# Patient Record
Sex: Female | Born: 1945 | Race: White | Hispanic: No | Marital: Single | Smoking: Current every day smoker
Health system: Southern US, Community
[De-identification: ages and names within clinical notes are randomized; demographics above are authoritative.]

## PROBLEM LIST (undated history)

## (undated) DIAGNOSIS — J441 Chronic obstructive pulmonary disease with (acute) exacerbation: Secondary | ICD-10-CM

## (undated) DIAGNOSIS — F17200 Nicotine dependence, unspecified, uncomplicated: Secondary | ICD-10-CM

## (undated) DIAGNOSIS — E785 Hyperlipidemia, unspecified: Secondary | ICD-10-CM

## (undated) DIAGNOSIS — F4310 Post-traumatic stress disorder, unspecified: Secondary | ICD-10-CM

## (undated) DIAGNOSIS — N6019 Diffuse cystic mastopathy of unspecified breast: Secondary | ICD-10-CM

## (undated) DIAGNOSIS — K219 Gastro-esophageal reflux disease without esophagitis: Secondary | ICD-10-CM

## (undated) DIAGNOSIS — I10 Essential (primary) hypertension: Secondary | ICD-10-CM

## (undated) DIAGNOSIS — Z0000 Encounter for general adult medical examination without abnormal findings: Secondary | ICD-10-CM

## (undated) DIAGNOSIS — L309 Dermatitis, unspecified: Secondary | ICD-10-CM

## (undated) DIAGNOSIS — C73 Malignant neoplasm of thyroid gland: Secondary | ICD-10-CM

## (undated) DIAGNOSIS — J449 Chronic obstructive pulmonary disease, unspecified: Secondary | ICD-10-CM

## (undated) DIAGNOSIS — G4700 Insomnia, unspecified: Secondary | ICD-10-CM

## (undated) DIAGNOSIS — M1990 Unspecified osteoarthritis, unspecified site: Secondary | ICD-10-CM

## (undated) DIAGNOSIS — C3490 Malignant neoplasm of unspecified part of unspecified bronchus or lung: Secondary | ICD-10-CM

## (undated) DIAGNOSIS — E039 Hypothyroidism, unspecified: Secondary | ICD-10-CM

## (undated) DIAGNOSIS — B029 Zoster without complications: Secondary | ICD-10-CM

## (undated) DIAGNOSIS — R1319 Other dysphagia: Secondary | ICD-10-CM

## (undated) HISTORY — DX: Dermatitis, unspecified: L30.9

## (undated) HISTORY — DX: Unspecified osteoarthritis, unspecified site: M19.90

## (undated) HISTORY — PX: OOPHORECTOMY: SHX86

## (undated) HISTORY — DX: Diffuse cystic mastopathy of unspecified breast: N60.19

## (undated) HISTORY — PX: THYROID SURGERY: SHX805

## (undated) HISTORY — DX: Chronic obstructive pulmonary disease with (acute) exacerbation: J44.1

## (undated) HISTORY — DX: Hypothyroidism, unspecified: E03.9

## (undated) HISTORY — DX: Zoster without complications: B02.9

## (undated) HISTORY — DX: Other dysphagia: R13.19

## (undated) HISTORY — DX: Nicotine dependence, unspecified, uncomplicated: F17.200

## (undated) HISTORY — DX: Gastro-esophageal reflux disease without esophagitis: K21.9

## (undated) HISTORY — DX: Chronic obstructive pulmonary disease, unspecified: J44.9

## (undated) HISTORY — DX: Hyperlipidemia, unspecified: E78.5

## (undated) HISTORY — DX: Malignant neoplasm of thyroid gland: C73

## (undated) HISTORY — PX: WISDOM TOOTH EXTRACTION: SHX21

## (undated) HISTORY — DX: Essential (primary) hypertension: I10

## (undated) HISTORY — DX: Insomnia, unspecified: G47.00

## (undated) HISTORY — PX: UTERINE FIBROID EMBOLIZATION: SHX825

## (undated) HISTORY — DX: Encounter for general adult medical examination without abnormal findings: Z00.00

## (undated) HISTORY — DX: Malignant neoplasm of unspecified part of unspecified bronchus or lung: C34.90

## (undated) HISTORY — PX: BREAST CYST EXCISION: SHX579

## (undated) HISTORY — DX: Post-traumatic stress disorder, unspecified: F43.10

---

## 2008-10-04 LAB — HM PAP SMEAR

## 2012-10-04 DIAGNOSIS — B029 Zoster without complications: Secondary | ICD-10-CM

## 2012-10-04 HISTORY — DX: Zoster without complications: B02.9

## 2012-11-29 DIAGNOSIS — K5900 Constipation, unspecified: Secondary | ICD-10-CM | POA: Diagnosis not present

## 2012-11-29 DIAGNOSIS — I10 Essential (primary) hypertension: Secondary | ICD-10-CM | POA: Diagnosis not present

## 2012-11-29 DIAGNOSIS — R1032 Left lower quadrant pain: Secondary | ICD-10-CM | POA: Diagnosis not present

## 2012-11-29 DIAGNOSIS — B029 Zoster without complications: Secondary | ICD-10-CM | POA: Diagnosis not present

## 2012-11-29 DIAGNOSIS — Z0000 Encounter for general adult medical examination without abnormal findings: Secondary | ICD-10-CM | POA: Diagnosis not present

## 2012-12-06 DIAGNOSIS — R1032 Left lower quadrant pain: Secondary | ICD-10-CM | POA: Diagnosis not present

## 2013-01-10 ENCOUNTER — Other Ambulatory Visit (HOSPITAL_BASED_OUTPATIENT_CLINIC_OR_DEPARTMENT_OTHER): Payer: Self-pay | Admitting: Family Medicine

## 2013-01-10 ENCOUNTER — Other Ambulatory Visit (HOSPITAL_BASED_OUTPATIENT_CLINIC_OR_DEPARTMENT_OTHER): Payer: Self-pay | Admitting: *Deleted

## 2013-01-10 DIAGNOSIS — Z1231 Encounter for screening mammogram for malignant neoplasm of breast: Secondary | ICD-10-CM

## 2013-01-18 ENCOUNTER — Ambulatory Visit (HOSPITAL_BASED_OUTPATIENT_CLINIC_OR_DEPARTMENT_OTHER)
Admission: RE | Admit: 2013-01-18 | Discharge: 2013-01-18 | Disposition: A | Discharge status: Home / Self Care | Payer: Medicare Other | Source: Ambulatory Visit | Attending: Family Medicine | Admitting: Family Medicine

## 2013-01-18 DIAGNOSIS — Z1231 Encounter for screening mammogram for malignant neoplasm of breast: Secondary | ICD-10-CM

## 2013-02-10 DIAGNOSIS — K219 Gastro-esophageal reflux disease without esophagitis: Secondary | ICD-10-CM | POA: Diagnosis not present

## 2013-02-10 DIAGNOSIS — J441 Chronic obstructive pulmonary disease with (acute) exacerbation: Secondary | ICD-10-CM | POA: Diagnosis not present

## 2013-02-10 DIAGNOSIS — J329 Chronic sinusitis, unspecified: Secondary | ICD-10-CM | POA: Diagnosis not present

## 2013-02-10 DIAGNOSIS — J45909 Unspecified asthma, uncomplicated: Secondary | ICD-10-CM | POA: Diagnosis not present

## 2013-03-09 DIAGNOSIS — H2510 Age-related nuclear cataract, unspecified eye: Secondary | ICD-10-CM | POA: Diagnosis not present

## 2013-04-10 ENCOUNTER — Ambulatory Visit (INDEPENDENT_AMBULATORY_CARE_PROVIDER_SITE_OTHER): Payer: Medicare Other | Admitting: Family Medicine

## 2013-04-10 ENCOUNTER — Telehealth: Payer: Self-pay | Admitting: Family Medicine

## 2013-04-10 ENCOUNTER — Encounter: Payer: Self-pay | Admitting: Family Medicine

## 2013-04-10 VITALS — BP 138/78 | HR 88 | Temp 98.0°F | Ht 64.0 in | Wt 137.1 lb

## 2013-04-10 DIAGNOSIS — I10 Essential (primary) hypertension: Secondary | ICD-10-CM | POA: Insufficient documentation

## 2013-04-10 DIAGNOSIS — N6019 Diffuse cystic mastopathy of unspecified breast: Secondary | ICD-10-CM

## 2013-04-10 DIAGNOSIS — E782 Mixed hyperlipidemia: Secondary | ICD-10-CM | POA: Insufficient documentation

## 2013-04-10 DIAGNOSIS — F4310 Post-traumatic stress disorder, unspecified: Secondary | ICD-10-CM

## 2013-04-10 DIAGNOSIS — L259 Unspecified contact dermatitis, unspecified cause: Secondary | ICD-10-CM | POA: Diagnosis not present

## 2013-04-10 DIAGNOSIS — E039 Hypothyroidism, unspecified: Secondary | ICD-10-CM

## 2013-04-10 DIAGNOSIS — E785 Hyperlipidemia, unspecified: Secondary | ICD-10-CM

## 2013-04-10 DIAGNOSIS — J441 Chronic obstructive pulmonary disease with (acute) exacerbation: Secondary | ICD-10-CM | POA: Diagnosis not present

## 2013-04-10 DIAGNOSIS — Z0000 Encounter for general adult medical examination without abnormal findings: Secondary | ICD-10-CM

## 2013-04-10 DIAGNOSIS — J449 Chronic obstructive pulmonary disease, unspecified: Secondary | ICD-10-CM | POA: Insufficient documentation

## 2013-04-10 DIAGNOSIS — B029 Zoster without complications: Secondary | ICD-10-CM | POA: Insufficient documentation

## 2013-04-10 DIAGNOSIS — L309 Dermatitis, unspecified: Secondary | ICD-10-CM

## 2013-04-10 HISTORY — DX: Encounter for general adult medical examination without abnormal findings: Z00.00

## 2013-04-10 HISTORY — DX: Post-traumatic stress disorder, unspecified: F43.10

## 2013-04-10 HISTORY — DX: Diffuse cystic mastopathy of unspecified breast: N60.19

## 2013-04-10 HISTORY — DX: Hyperlipidemia, unspecified: E78.5

## 2013-04-10 HISTORY — DX: Dermatitis, unspecified: L30.9

## 2013-04-10 MED ORDER — HYDROCORTISONE-ACETIC ACID 1-2 % OT SOLN: 4.0000 [drp] | Freq: Two times a day (BID) | OTIC | Status: DC

## 2013-04-10 MED ORDER — CLOBETASOL PROPIONATE 0.05 % EX OINT: TOPICAL_OINTMENT | Freq: Two times a day (BID) | CUTANEOUS | Status: DC

## 2013-04-10 NOTE — Assessment & Plan Note (Signed)
Recent episode over left flank, declines shingles shots

## 2013-04-10 NOTE — Assessment & Plan Note (Signed)
Well controlled, no changes 

## 2013-04-10 NOTE — Assessment & Plan Note (Addendum)
Clobetasol cream given to use over area.

## 2013-04-10 NOTE — Assessment & Plan Note (Signed)
Tearful initially in visit but believes over all she is doing better as her divorce has finalized. She has done a great deal of counseling but she will return if she needs to
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
## 2013-04-10 NOTE — Assessment & Plan Note (Signed)
Declines Zostavax and colonoscopy. Request old records

## 2013-04-10 NOTE — Telephone Encounter (Signed)
Labs prior to visit, lipid, renal, hepatic, tsh, cbc   Patient has appointment on 06/01/13, she will be going to [facility] lab

## 2013-04-10 NOTE — Patient Instructions (Addendum)
Labs prior to visit, lipid, renal, hepatic, tsh, cbc  Probiotics daily such as Digestive Advantage   Moist heat to neck and Salon Pas cream  Preventive Care for Adults, Female A healthy lifestyle and preventive care can promote health and wellness. Preventive health guidelines for women include the following key practices.  A routine yearly physical is a good way to check with your caregiver about your health and preventive screening. It is a chance to share any concerns and updates on your health, and to receive a thorough exam.  Visit your dentist for a routine exam and preventive care every 6 months. Brush your teeth twice a day and floss once a day. Good oral hygiene prevents tooth decay and gum disease.  The frequency of eye exams is based on your age, health, family medical history, use of contact lenses, and other factors. Follow your caregiver's recommendations for frequency of eye exams.  Eat a healthy diet. Foods like vegetables, fruits, whole grains, low-fat dairy products, and lean protein foods contain the nutrients you need without too many calories. Decrease your intake of foods high in solid fats, added sugars, and salt. Eat the right amount of calories for you.Get information about a proper diet from your caregiver, if necessary.  Regular physical exercise is one of the most important things you can do for your health. Most adults should get at least 150 minutes of moderate-intensity exercise (any activity that increases your heart rate and causes you to sweat) each week. In addition, most adults need muscle-strengthening exercises on 2 or more days a week.  Maintain a healthy weight. The body mass index (BMI) is a screening tool to identify possible weight problems. It provides an estimate of body fat based on height and weight. Your caregiver can help determine your BMI, and can help you achieve or maintain a healthy weight.For adults 20 years and older:  A BMI below 18.5 is  considered underweight.  A BMI of 18.5 to 24.9 is normal.  A BMI of 25 to 29.9 is considered overweight.  A BMI of 30 and above is considered obese.  Maintain normal blood lipids and cholesterol levels by exercising and minimizing your intake of saturated fat. Eat a balanced diet with plenty of fruit and vegetables. Blood tests for lipids and cholesterol should begin at age 67 and be repeated every 5 years. If your lipid or cholesterol levels are high, you are over 50, or you are at high risk for heart disease, you may need your cholesterol levels checked more frequently.Ongoing high lipid and cholesterol levels should be treated with medicines if diet and exercise are not effective.  If you smoke, find out from your caregiver how to quit. If you do not use tobacco, do not start.  If you are pregnant, do not drink alcohol. If you are breastfeeding, be very cautious about drinking alcohol. If you are not pregnant and choose to drink alcohol, do not exceed 1 drink per day. One drink is considered to be 12 ounces (355 mL) of beer, 5 ounces (148 mL) of wine, or 1.5 ounces (44 mL) of liquor.  Avoid use of street drugs. Do not share needles with anyone. Ask for help if you need support or instructions about stopping the use of drugs.  High blood pressure causes heart disease and increases the risk of stroke. Your blood pressure should be checked at least every 1 to 2 years. Ongoing high blood pressure should be treated with medicines if weight loss  and exercise are not effective.  If you are 50 to 65 years old, ask your caregiver if you should take aspirin to prevent strokes.  Diabetes screening involves taking a blood sample to check your fasting blood sugar level. This should be done once every 3 years, after age 63, if you are within normal weight and without risk factors for diabetes. Testing should be considered at a younger age or be carried out more frequently if you are overweight and have at  least 1 risk factor for diabetes.  Breast cancer screening is essential preventive care for women. You should practice "breast self-awareness." This means understanding the normal appearance and feel of your breasts and may include breast self-examination. Any changes detected, no matter how small, should be reported to a caregiver. Women in their 11s and 30s should have a clinical breast exam (CBE) by a caregiver as part of a regular health exam every 1 to 3 years. After age 30, women should have a CBE every year. Starting at age 66, women should consider having a mammography (breast X-ray test) every year. Women who have a family history of breast cancer should talk to their caregiver about genetic screening. Women at a high risk of breast cancer should talk to their caregivers about having magnetic resonance imaging (MRI) and a mammography every year.  The Pap test is a screening test for cervical cancer. A Pap test can show cell changes on the cervix that might become cervical cancer if left untreated. A Pap test is a procedure in which cells are obtained and examined from the lower end of the uterus (cervix).  Women should have a Pap test starting at age 77.  Between ages 19 and 70, Pap tests should be repeated every 2 years.  Beginning at age 71, you should have a Pap test every 3 years as long as the past 3 Pap tests have been normal.  Some women have medical problems that increase the chance of getting cervical cancer. Talk to your caregiver about these problems. It is especially important to talk to your caregiver if a new problem develops soon after your last Pap test. In these cases, your caregiver may recommend more frequent screening and Pap tests.  The above recommendations are the same for women who have or have not gotten the vaccine for human papillomavirus (HPV).  If you had a hysterectomy for a problem that was not cancer or a condition that could lead to cancer, then you no longer  need Pap tests. Even if you no longer need a Pap test, a regular exam is a good idea to make sure no other problems are starting.  If you are between ages 87 and 4, and you have had normal Pap tests going back 10 years, you no longer need Pap tests. Even if you no longer need a Pap test, a regular exam is a good idea to make sure no other problems are starting.  If you have had past treatment for cervical cancer or a condition that could lead to cancer, you need Pap tests and screening for cancer for at least 20 years after your treatment.  If Pap tests have been discontinued, risk factors (such as a new sexual partner) need to be reassessed to determine if screening should be resumed.  The HPV test is an additional test that may be used for cervical cancer screening. The HPV test looks for the virus that can cause the cell changes on the cervix. The  cells collected during the Pap test can be tested for HPV. The HPV test could be used to screen women aged 70 years and older, and should be used in women of any age who have unclear Pap test results. After the age of 27, women should have HPV testing at the same frequency as a Pap test.  Colorectal cancer can be detected and often prevented. Most routine colorectal cancer screening begins at the age of 73 and continues through age 35. However, your caregiver may recommend screening at an earlier age if you have risk factors for colon cancer. On a yearly basis, your caregiver may provide home test kits to check for hidden blood in the stool. Use of a small camera at the end of a tube, to directly examine the colon (sigmoidoscopy or colonoscopy), can detect the earliest forms of colorectal cancer. Talk to your caregiver about this at age 4, when routine screening begins. Direct examination of the colon should be repeated every 5 to 10 years through age 62, unless early forms of pre-cancerous polyps or small growths are found.  Hepatitis C blood testing is  recommended for all people born from [birth year] through 1965 and any individual with known risks for hepatitis C.  Practice safe sex. Use condoms and avoid high-risk sexual practices to reduce the spread of sexually transmitted infections (STIs). STIs include gonorrhea, chlamydia, syphilis, trichomonas, herpes, HPV, and human immunodeficiency virus (HIV). Herpes, HIV, and HPV are viral illnesses that have no cure. They can result in disability, cancer, and death. Sexually active women aged 57 and younger should be checked for chlamydia. Older women with new or multiple partners should also be tested for chlamydia. Testing for other STIs is recommended if you are sexually active and at increased risk.  Osteoporosis is a disease in which the bones lose minerals and strength with aging. This can result in serious bone fractures. The risk of osteoporosis can be identified using a bone density scan. Women ages 38 and over and women at risk for fractures or osteoporosis should discuss screening with their caregivers. Ask your caregiver whether you should take a calcium supplement or vitamin D to reduce the rate of osteoporosis.  Menopause can be associated with physical symptoms and risks. Hormone replacement therapy is available to decrease symptoms and risks. You should talk to your caregiver about whether hormone replacement therapy is right for you.  Use sunscreen with sun protection factor (SPF) of 30 or more. Apply sunscreen liberally and repeatedly throughout the day. You should seek shade when your shadow is shorter than you. Protect yourself by wearing long sleeves, pants, a wide-brimmed hat, and sunglasses year round, whenever you are outdoors.  Once a month, do a whole body skin exam, using a mirror to look at the skin on your back. Notify your caregiver of new moles, moles that have irregular borders, moles that are larger than a pencil eraser, or moles that have changed in shape or color.  Stay current  with required immunizations.  Influenza. You need a dose every fall (or winter). The composition of the flu vaccine changes each year, so being vaccinated once is not enough.  Pneumococcal polysaccharide. You need 1 to 2 doses if you smoke cigarettes or if you have certain chronic medical conditions. You need 1 dose at age 19 (or older) if you have never been vaccinated.  Tetanus, diphtheria, pertussis (Tdap, Td). Get 1 dose of Tdap vaccine if you are younger than age 41, are over 15  and have contact with an infant, are a Research scientist (physical sciences), are pregnant, or simply want to be protected from whooping cough. After that, you need a Td booster dose every 10 years. Consult your caregiver if you have not had at least 3 tetanus and diphtheria-containing shots sometime in your life or have a deep or dirty wound.  HPV. You need this vaccine if you are a woman age 75 or younger. The vaccine is given in 3 doses over 6 months.  Measles, mumps, rubella (MMR). You need at least 1 dose of MMR if you were born in 1957 or later. You may also need a second dose.  Meningococcal. If you are age 55 to 72 and a first-year college student living in a residence hall, or have one of several medical conditions, you need to get vaccinated against meningococcal disease. You may also need additional booster doses.  Zoster (shingles). If you are age 4 or older, you should get this vaccine.  Varicella (chickenpox). If you have never had chickenpox or you were vaccinated but received only 1 dose, talk to your caregiver to find out if you need this vaccine.  Hepatitis A. You need this vaccine if you have a specific risk factor for hepatitis A virus infection or you simply wish to be protected from this disease. The vaccine is usually given as 2 doses, 6 to 18 months apart.  Hepatitis B. You need this vaccine if you have a specific risk factor for hepatitis B virus infection or you simply wish to be protected from this disease.  The vaccine is given in 3 doses, usually over 6 months. Preventive Services / Frequency Ages 31 to 46  Blood pressure check.** / Every 1 to 2 years.  Lipid and cholesterol check.** / Every 5 years beginning at age 46.  Clinical breast exam.** / Every 3 years for women in their 25s and 30s.  Pap test.** / Every 2 years from ages 58 through 8. Every 3 years starting at age 61 through age 58 or 19 with a history of 3 consecutive normal Pap tests.  HPV screening.** / Every 3 years from ages 8 through ages 43 to 3 with a history of 3 consecutive normal Pap tests.  Hepatitis C blood test.** / For any individual with known risks for hepatitis C.  Skin self-exam. / Monthly.  Influenza immunization.** / Every year.  Pneumococcal polysaccharide immunization.** / 1 to 2 doses if you smoke cigarettes or if you have certain chronic medical conditions.  Tetanus, diphtheria, pertussis (Tdap, Td) immunization. / A one-time dose of Tdap vaccine. After that, you need a Td booster dose every 10 years.  HPV immunization. / 3 doses over 6 months, if you are 77 and younger.  Measles, mumps, rubella (MMR) immunization. / You need at least 1 dose of MMR if you were born in 1957 or later. You may also need a second dose.  Meningococcal immunization. / 1 dose if you are age 69 to 67 and a first-year college student living in a residence hall, or have one of several medical conditions, you need to get vaccinated against meningococcal disease. You may also need additional booster doses.  Varicella immunization.** / Consult your caregiver.  Hepatitis A immunization.** / Consult your caregiver. 2 doses, 6 to 18 months apart.  Hepatitis B immunization.** / Consult your caregiver. 3 doses usually over 6 months. Ages 35 to 75  Blood pressure check.** / Every 1 to 2 years.  Lipid and cholesterol check.** /  Every 5 years beginning at age 41.  Clinical breast exam.** / Every year after age  45.  Mammogram.** / Every year beginning at age 64 and continuing for as long as you are in good health. Consult with your caregiver.  Pap test.** / Every 3 years starting at age 43 through age 87 or 67 with a history of 3 consecutive normal Pap tests.  HPV screening.** / Every 3 years from ages 72 through ages 11 to 87 with a history of 3 consecutive normal Pap tests.  Fecal occult blood test (FOBT) of stool. / Every year beginning at age 54 and continuing until age 69. You may not need to do this test if you get a colonoscopy every 10 years.  Flexible sigmoidoscopy or colonoscopy.** / Every 5 years for a flexible sigmoidoscopy or every 10 years for a colonoscopy beginning at age 59 and continuing until age 40.  Hepatitis C blood test.** / For all people born from [birth year] through 1965 and any individual with known risks for hepatitis C.  Skin self-exam. / Monthly.  Influenza immunization.** / Every year.  Pneumococcal polysaccharide immunization.** / 1 to 2 doses if you smoke cigarettes or if you have certain chronic medical conditions.  Tetanus, diphtheria, pertussis (Tdap, Td) immunization.** / A one-time dose of Tdap vaccine. After that, you need a Td booster dose every 10 years.  Measles, mumps, rubella (MMR) immunization. / You need at least 1 dose of MMR if you were born in 1957 or later. You may also need a second dose.  Varicella immunization.** / Consult your caregiver.  Meningococcal immunization.** / Consult your caregiver.  Hepatitis A immunization.** / Consult your caregiver. 2 doses, 6 to 18 months apart.  Hepatitis B immunization.** / Consult your caregiver. 3 doses, usually over 6 months. Ages 26 and over  Blood pressure check.** / Every 1 to 2 years.  Lipid and cholesterol check.** / Every 5 years beginning at age 72.  Clinical breast exam.** / Every year after age 48.  Mammogram.** / Every year beginning at age 61 and continuing for as long as you are in good  health. Consult with your caregiver.  Pap test.** / Every 3 years starting at age 57 through age 5 or 38 with a 3 consecutive normal Pap tests. Testing can be stopped between 65 and 70 with 3 consecutive normal Pap tests and no abnormal Pap or HPV tests in the past 10 years.  HPV screening.** / Every 3 years from ages 25 through ages 77 or 29 with a history of 3 consecutive normal Pap tests. Testing can be stopped between 65 and 70 with 3 consecutive normal Pap tests and no abnormal Pap or HPV tests in the past 10 years.  Fecal occult blood test (FOBT) of stool. / Every year beginning at age 6 and continuing until age 68. You may not need to do this test if you get a colonoscopy every 10 years.  Flexible sigmoidoscopy or colonoscopy.** / Every 5 years for a flexible sigmoidoscopy or every 10 years for a colonoscopy beginning at age 70 and continuing until age 49.  Hepatitis C blood test.** / For all people born from [birth year] through 1965 and any individual with known risks for hepatitis C.  Osteoporosis screening.** / A one-time screening for women ages 73 and over and women at risk for fractures or osteoporosis.  Skin self-exam. / Monthly.  Influenza immunization.** / Every year.  Pneumococcal polysaccharide immunization.** / 1 dose at age 48 (  or older) if you have never been vaccinated.  Tetanus, diphtheria, pertussis (Tdap, Td) immunization. / A one-time dose of Tdap vaccine if you are over 65 and have contact with an infant, are a Research scientist (physical sciences), or simply want to be protected from whooping cough. After that, you need a Td booster dose every 10 years.  Varicella immunization.** / Consult your caregiver.  Meningococcal immunization.** / Consult your caregiver.  Hepatitis A immunization.** / Consult your caregiver. 2 doses, 6 to 18 months apart.  Hepatitis B immunization.** / Check with your caregiver. 3 doses, usually over 6 months. ** Family history and personal history of risk and  conditions may change your caregiver's recommendations. Document Released: 11/16/2001 Document Revised: 12/13/2011 Document Reviewed: 02/15/2011 [facility] Patient Information 2014 [place], [place].

## 2013-04-10 NOTE — Assessment & Plan Note (Signed)
Avoid trans fats, will request old records and obtain new labs prior to next visit, encouraged Krill oil caps and will consider medications after that.

## 2013-04-10 NOTE — Assessment & Plan Note (Signed)
Check labs prior to visit.

## 2013-04-10 NOTE — Assessment & Plan Note (Signed)
Encouraged complete cessation. 

## 2013-04-18 ENCOUNTER — Telehealth: Payer: Self-pay

## 2013-04-18 NOTE — Telephone Encounter (Signed)
Pt left a message stating that her insurance won't pay for Vosol. Pt needs something generic called in for her ears. Please advise?

## 2013-04-18 NOTE — Telephone Encounter (Signed)
Ok to d/c voscol and call in cortisporin hc otic drops 3 drops b/l ears bid x 7 days

## 2013-04-19 MED ORDER — NEOMYCIN-POLYMYXIN-HC 3.5-10000-1 OT SOLN: 3.0000 [drp] | Freq: Two times a day (BID) | OTIC | Status: DC

## 2013-04-19 NOTE — Addendum Note (Signed)
Addended by: Court Joy on: 04/19/2013 09:07 AM   Modules accepted: Orders

## 2013-04-19 NOTE — Telephone Encounter (Signed)
RX sent, D/C voscol and pt informed

## 2013-05-22 ENCOUNTER — Telehealth: Payer: Self-pay

## 2013-05-22 NOTE — Telephone Encounter (Signed)
Pt called stating that she has misplaced her lab orders and wants to get labs done on the 22nd.   I called and informed pt that the lab order is in the computer and she doesn't need the papers.

## 2013-05-25 DIAGNOSIS — E785 Hyperlipidemia, unspecified: Secondary | ICD-10-CM | POA: Diagnosis not present

## 2013-05-25 DIAGNOSIS — I10 Essential (primary) hypertension: Secondary | ICD-10-CM | POA: Diagnosis not present

## 2013-05-25 DIAGNOSIS — E039 Hypothyroidism, unspecified: Secondary | ICD-10-CM | POA: Diagnosis not present

## 2013-05-25 LAB — HEPATIC FUNCTION PANEL
ALT: 11 U/L (ref 0–35)
AST: 18 U/L (ref 0–37)
Bilirubin, Direct: 0.2 mg/dL (ref 0.0–0.3)
Indirect Bilirubin: 0.7 mg/dL (ref 0.0–0.9)
Total Protein: 6.4 g/dL (ref 6.0–8.3)

## 2013-05-25 LAB — RENAL FUNCTION PANEL
BUN: 14 mg/dL (ref 6–23)
CO2: 27 mEq/L (ref 19–32)
Calcium: 9.5 mg/dL (ref 8.4–10.5)
Glucose, Bld: 94 mg/dL (ref 70–99)
Potassium: 4.6 mEq/L (ref 3.5–5.3)
Sodium: 141 mEq/L (ref 135–145)

## 2013-05-25 LAB — CBC
HCT: 42 % (ref 36.0–46.0)
MCH: 30.5 pg (ref 26.0–34.0)
MCHC: 35 g/dL (ref 30.0–36.0)
MCV: 87.1 fL (ref 78.0–100.0)
Platelets: 174 10*3/uL (ref 150–400)
RDW: 14.3 % (ref 11.5–15.5)
WBC: 7.7 10*3/uL (ref 4.0–10.5)

## 2013-05-25 LAB — LIPID PANEL
Cholesterol: 244 mg/dL — ABNORMAL HIGH (ref 0–200)
HDL: 44 mg/dL (ref >39)
Total CHOL/HDL Ratio: 5.5 Ratio

## 2013-06-01 ENCOUNTER — Encounter: Payer: Self-pay | Admitting: Family Medicine

## 2013-06-01 ENCOUNTER — Ambulatory Visit (INDEPENDENT_AMBULATORY_CARE_PROVIDER_SITE_OTHER): Payer: Medicare Other | Admitting: Family Medicine

## 2013-06-01 VITALS — BP 148/88 | HR 83 | Temp 98.1°F | Ht 64.0 in | Wt 140.8 lb

## 2013-06-01 DIAGNOSIS — E785 Hyperlipidemia, unspecified: Secondary | ICD-10-CM

## 2013-06-01 DIAGNOSIS — E039 Hypothyroidism, unspecified: Secondary | ICD-10-CM | POA: Diagnosis not present

## 2013-06-01 DIAGNOSIS — L0390 Cellulitis, unspecified: Secondary | ICD-10-CM

## 2013-06-01 DIAGNOSIS — L0291 Cutaneous abscess, unspecified: Secondary | ICD-10-CM

## 2013-06-01 DIAGNOSIS — F4310 Post-traumatic stress disorder, unspecified: Secondary | ICD-10-CM

## 2013-06-01 DIAGNOSIS — I10 Essential (primary) hypertension: Secondary | ICD-10-CM

## 2013-06-01 DIAGNOSIS — G4700 Insomnia, unspecified: Secondary | ICD-10-CM

## 2013-06-01 MED ORDER — AMLODIPINE BESYLATE 5 MG PO TABS: 5.0000 mg | ORAL_TABLET | Freq: Every day | ORAL | Status: DC

## 2013-06-01 NOTE — Patient Instructions (Addendum)
Melatonin 5 to 10 mg at bedtime to see if that helps  No trans fats/partially hydrogenated oils in frozen foods, peanut butter, bread   Start Krill oil caps such as MegaRed caps daily  Red Yeast Rice is a natural statin Co Q 10 enzyme daily  Probiotics daily such as Digestive Advantage or Align or a generic   Insomnia Insomnia is frequent trouble falling and/or staying asleep. Insomnia can be a long term problem or a short term problem. Both are common. Insomnia can be a short term problem when the wakefulness is related to a certain stress or worry. Long term insomnia is often related to ongoing stress during waking hours and/or poor sleeping habits. Overtime, sleep deprivation itself can make the problem worse. Every little thing feels more severe because you are overtired and your ability to cope is decreased. CAUSES   Stress, anxiety, and depression.  Poor sleeping habits.  Distractions such as TV in the bedroom.  Naps close to bedtime.  Engaging in emotionally charged conversations before bed.  Technical reading before sleep.  Alcohol and other sedatives. They may make the problem worse. They can hurt normal sleep patterns and normal dream activity.  Stimulants such as caffeine for several hours prior to bedtime.  Pain syndromes and shortness of breath can cause insomnia.  Exercise late at night.  Changing time zones may cause sleeping problems (jet lag). It is sometimes helpful to have someone observe your sleeping patterns. They should look for periods of not breathing during the night (sleep apnea). They should also look to see how long those periods last. If you live alone or observers are uncertain, you can also be observed at a sleep clinic where your sleep patterns will be professionally monitored. Sleep apnea requires a checkup and treatment. Give your caregivers your medical history. Give your caregivers observations your family has made about your sleep.  SYMPTOMS    Not feeling rested in the morning.  Anxiety and restlessness at bedtime.  Difficulty falling and staying asleep. TREATMENT   Your caregiver may prescribe treatment for an underlying medical disorders. Your caregiver can give advice or help if you are using alcohol or other drugs for self-medication. Treatment of underlying problems will usually eliminate insomnia problems.  Medications can be prescribed for short time use. They are generally not recommended for lengthy use.  Over-the-counter sleep medicines are not recommended for lengthy use. They can be habit forming.  You can promote easier sleeping by making lifestyle changes such as:  Using relaxation techniques that help with breathing and reduce muscle tension.  Exercising earlier in the day.  Changing your diet and the time of your last meal. No night time snacks.  Establish a regular time to go to bed.  Counseling can help with stressful problems and worry.  Soothing music and white noise may be helpful if there are background noises you cannot remove.  Stop tedious detailed work at least one hour before bedtime. HOME CARE INSTRUCTIONS   Keep a diary. Inform your caregiver about your progress. This includes any medication side effects. See your caregiver regularly. Take note of:  Times when you are asleep.  Times when you are awake during the night.  The quality of your sleep.  How you feel the next day. This information will help your caregiver care for you.  Get out of bed if you are still awake after 15 minutes. Read or do some quiet activity. Keep the lights down. Wait until you feel  sleepy and go back to bed.  Keep regular sleeping and waking hours. Avoid naps.  Exercise regularly.  Avoid distractions at bedtime. Distractions include watching television or engaging in any intense or detailed activity like attempting to balance the household checkbook.  Develop a bedtime ritual. Keep a familiar  routine of bathing, brushing your teeth, climbing into bed at the same time each night, listening to soothing music. Routines increase the success of falling to sleep faster.  Use relaxation techniques. This can be using breathing and muscle tension release routines. It can also include visualizing peaceful scenes. You can also help control troubling or intruding thoughts by keeping your mind occupied with boring or repetitive thoughts like the old concept of counting sheep. You can make it more creative like imagining planting one beautiful flower after another in your backyard garden.  During your day, work to eliminate stress. When this is not possible use some of the previous suggestions to help reduce the anxiety that accompanies stressful situations. MAKE SURE YOU:   Understand these instructions.  Will watch your condition.  Will get help right away if you are not doing well or get worse. Document Released: 09/17/2000 Document Revised: 12/13/2011 Document Reviewed: 10/18/2007 [facility] Patient Information 2014 [place], [place].

## 2013-06-04 ENCOUNTER — Encounter: Payer: Self-pay | Admitting: Family Medicine

## 2013-06-04 DIAGNOSIS — G4700 Insomnia, unspecified: Secondary | ICD-10-CM

## 2013-06-04 HISTORY — DX: Insomnia, unspecified: G47.00

## 2013-06-04 NOTE — Assessment & Plan Note (Signed)
Doing well without meds

## 2013-06-04 NOTE — Assessment & Plan Note (Signed)
May try Melatonin

## 2013-06-04 NOTE — Assessment & Plan Note (Signed)
tsh wnl

## 2013-06-04 NOTE — Assessment & Plan Note (Signed)
Well controlled no changes 

## 2013-06-04 NOTE — Assessment & Plan Note (Signed)
Mild, avoid simple  carbs and saturated fats, avoid trans fats, add krill oil caps
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
## 2013-08-09 ENCOUNTER — Other Ambulatory Visit: Payer: Self-pay

## 2013-12-14 ENCOUNTER — Telehealth: Payer: Self-pay | Admitting: Family Medicine

## 2013-12-14 DIAGNOSIS — Z0000 Encounter for general adult medical examination without abnormal findings: Secondary | ICD-10-CM

## 2013-12-14 DIAGNOSIS — I10 Essential (primary) hypertension: Secondary | ICD-10-CM

## 2013-12-14 DIAGNOSIS — E039 Hypothyroidism, unspecified: Secondary | ICD-10-CM

## 2013-12-14 DIAGNOSIS — E785 Hyperlipidemia, unspecified: Secondary | ICD-10-CM

## 2013-12-14 NOTE — Telephone Encounter (Signed)
And HTN

## 2013-12-14 NOTE — Telephone Encounter (Signed)
Patient scheduled cpe for June but states that she is due now for labs because of her medication.

## 2013-12-14 NOTE — Telephone Encounter (Signed)
Labs lipid, renal, cbc, tsh, hepatic for hyperlipid and annual

## 2013-12-14 NOTE — Telephone Encounter (Signed)
Please advise 

## 2013-12-17 ENCOUNTER — Other Ambulatory Visit: Payer: Self-pay | Admitting: Family Medicine

## 2013-12-17 DIAGNOSIS — Z1231 Encounter for screening mammogram for malignant neoplasm of breast: Secondary | ICD-10-CM

## 2014-01-23 ENCOUNTER — Ambulatory Visit (HOSPITAL_BASED_OUTPATIENT_CLINIC_OR_DEPARTMENT_OTHER)
Admission: RE | Admit: 2014-01-23 | Discharge: 2014-01-23 | Disposition: A | Discharge status: Home / Self Care | Payer: Medicare Other | Source: Ambulatory Visit | Attending: Family Medicine | Admitting: Family Medicine

## 2014-01-23 DIAGNOSIS — Z1231 Encounter for screening mammogram for malignant neoplasm of breast: Secondary | ICD-10-CM | POA: Diagnosis not present

## 2014-01-31 ENCOUNTER — Telehealth: Payer: Self-pay

## 2014-01-31 NOTE — Telephone Encounter (Signed)
Pt states that COPD was put on her charts in 2002 and she wants this off. Scheduled an appt for 5-28 to discuss this

## 2014-01-31 NOTE — Telephone Encounter (Signed)
Pt left a message stating that she is changing insurance and has questions?

## 2014-02-13 DIAGNOSIS — H2510 Age-related nuclear cataract, unspecified eye: Secondary | ICD-10-CM | POA: Diagnosis not present

## 2014-02-28 ENCOUNTER — Other Ambulatory Visit: Payer: Self-pay | Admitting: Family Medicine

## 2014-02-28 ENCOUNTER — Ambulatory Visit (INDEPENDENT_AMBULATORY_CARE_PROVIDER_SITE_OTHER): Payer: Medicare Other | Admitting: Family Medicine

## 2014-02-28 ENCOUNTER — Ambulatory Visit (HOSPITAL_BASED_OUTPATIENT_CLINIC_OR_DEPARTMENT_OTHER)
Admission: RE | Admit: 2014-02-28 | Discharge: 2014-02-28 | Disposition: A | Discharge status: Home / Self Care | Payer: Medicare Other | Source: Ambulatory Visit | Attending: Family Medicine | Admitting: Family Medicine

## 2014-02-28 ENCOUNTER — Encounter: Payer: Self-pay | Admitting: Family Medicine

## 2014-02-28 VITALS — BP 142/74 | HR 86 | Temp 97.6°F | Ht 64.0 in | Wt 142.1 lb

## 2014-02-28 DIAGNOSIS — R220 Localized swelling, mass and lump, head: Secondary | ICD-10-CM | POA: Diagnosis not present

## 2014-02-28 DIAGNOSIS — I10 Essential (primary) hypertension: Secondary | ICD-10-CM | POA: Diagnosis not present

## 2014-02-28 DIAGNOSIS — Z780 Asymptomatic menopausal state: Secondary | ICD-10-CM

## 2014-02-28 DIAGNOSIS — E785 Hyperlipidemia, unspecified: Secondary | ICD-10-CM | POA: Diagnosis not present

## 2014-02-28 DIAGNOSIS — J449 Chronic obstructive pulmonary disease, unspecified: Secondary | ICD-10-CM

## 2014-02-28 DIAGNOSIS — R221 Localized swelling, mass and lump, neck: Secondary | ICD-10-CM

## 2014-02-28 DIAGNOSIS — R1013 Epigastric pain: Secondary | ICD-10-CM

## 2014-02-28 DIAGNOSIS — K219 Gastro-esophageal reflux disease without esophagitis: Secondary | ICD-10-CM

## 2014-02-28 DIAGNOSIS — E049 Nontoxic goiter, unspecified: Secondary | ICD-10-CM | POA: Diagnosis not present

## 2014-02-28 LAB — CBC
HCT: 41.4 % (ref 36.0–46.0)
Hemoglobin: 14.2 g/dL (ref 12.0–15.0)
MCH: 29.5 pg (ref 26.0–34.0)
MCHC: 34.3 g/dL (ref 30.0–36.0)
MCV: 85.9 fL (ref 78.0–100.0)
PLATELETS: 165 10*3/uL (ref 150–400)
RBC: 4.82 MIL/uL (ref 3.87–5.11)
RDW: 14.1 % (ref 11.5–15.5)
WBC: 8 10*3/uL (ref 4.0–10.5)

## 2014-02-28 LAB — LIPID PANEL
CHOL/HDL RATIO: 5.4 ratio
CHOLESTEROL: 234 mg/dL — AB (ref 0–200)
HDL: 43 mg/dL (ref >39)
LDL Cholesterol: 161 mg/dL — ABNORMAL HIGH (ref 0–99)
Triglycerides: 152 mg/dL — ABNORMAL HIGH (ref <150)
VLDL: 30 mg/dL (ref 0–40)

## 2014-02-28 LAB — HEPATIC FUNCTION PANEL
ALBUMIN: 4.3 g/dL (ref 3.5–5.2)
ALT: 9 U/L (ref 0–35)
AST: 18 U/L (ref 0–37)
Alkaline Phosphatase: 80 U/L (ref 39–117)
BILIRUBIN DIRECT: 0.1 mg/dL (ref 0.0–0.3)
BILIRUBIN TOTAL: 0.4 mg/dL (ref 0.2–1.2)
Indirect Bilirubin: 0.3 mg/dL (ref 0.2–1.2)
Total Protein: 6.4 g/dL (ref 6.0–8.3)

## 2014-02-28 LAB — RENAL FUNCTION PANEL
ALBUMIN: 4.3 g/dL (ref 3.5–5.2)
BUN: 18 mg/dL (ref 6–23)
CALCIUM: 8.9 mg/dL (ref 8.4–10.5)
CO2: 25 mEq/L (ref 19–32)
CREATININE: 0.71 mg/dL (ref 0.50–1.10)
Chloride: 105 mEq/L (ref 96–112)
Glucose, Bld: 97 mg/dL (ref 70–99)
Phosphorus: 3.1 mg/dL (ref 2.3–4.6)
Potassium: 4 mEq/L (ref 3.5–5.3)
SODIUM: 141 meq/L (ref 135–145)

## 2014-02-28 MED ORDER — OMEPRAZOLE 20 MG PO CPDR: 20.0000 mg | DELAYED_RELEASE_CAPSULE | Freq: Every day | ORAL | Status: DC

## 2014-02-28 NOTE — Assessment & Plan Note (Signed)
Encouraged heart healthy diet, increase exercise, avoid trans fats, consider a krill oil cap daily 

## 2014-02-28 NOTE — Assessment & Plan Note (Addendum)
Patient not symptomatic but is concerned about the diagnosis in her chart we will consider referral or PFTs at patient discretion, declines referral. Patient made aware that with her smoking history she is very unlikely not to have some level of COPD

## 2014-02-28 NOTE — Progress Notes (Signed)
Pre visit review using our clinic review tool, if applicable. No additional management support is needed unless otherwise documented below in the visit note. 

## 2014-02-28 NOTE — Patient Instructions (Signed)
Probiotic daily such as Digestive Advantage or [name]   Gastroesophageal Reflux Disease, Adult Gastroesophageal reflux disease (GERD) happens when acid from your stomach flows up into the esophagus. When acid comes in contact with the esophagus, the acid causes soreness (inflammation) in the esophagus. Over time, GERD may create small holes (ulcers) in the lining of the esophagus. CAUSES   Increased body weight. This puts pressure on the stomach, making acid rise from the stomach into the esophagus.  Smoking. This increases acid production in the stomach.  Drinking alcohol. This causes decreased pressure in the lower esophageal sphincter (valve or ring of muscle between the esophagus and stomach), allowing acid from the stomach into the esophagus.  Late evening meals and a full stomach. This increases pressure and acid production in the stomach.  A malformed lower esophageal sphincter. Sometimes, no cause is found. SYMPTOMS   Burning pain in the lower part of the mid-chest behind the breastbone and in the mid-stomach area. This may occur twice a week or more often.  Trouble swallowing.  Sore throat.  Dry cough.  Asthma-like symptoms including chest tightness, shortness of breath, or wheezing. DIAGNOSIS  Your caregiver may be able to diagnose GERD based on your symptoms. In some cases, X-rays and other tests may be done to check for complications or to check the condition of your stomach and esophagus. TREATMENT  Your caregiver may recommend over-the-counter or prescription medicines to help decrease acid production. Ask your caregiver before starting or adding any new medicines.  HOME CARE INSTRUCTIONS   Change the factors that you can control. Ask your caregiver for guidance concerning weight loss, quitting smoking, and alcohol consumption.  Avoid foods and drinks that make your symptoms worse, such as:  Caffeine or alcoholic drinks.  Chocolate.  Peppermint  or mint flavorings.  Garlic and onions.  Spicy foods.  Citrus fruits, such as oranges, lemons, or limes.  Tomato-based foods such as sauce, chili, salsa, and pizza.  Fried and fatty foods.  Avoid lying down for the 3 hours prior to your bedtime or prior to taking a nap.  Eat small, frequent meals instead of large meals.  Wear loose-fitting clothing. Do not wear anything tight around your waist that causes pressure on your stomach.  Raise the head of your bed 6 to 8 inches with wood blocks to help you sleep. Extra pillows will not help.  Only take over-the-counter or prescription medicines for pain, discomfort, or fever as directed by your caregiver.  Do not take aspirin, ibuprofen, or other nonsteroidal anti-inflammatory drugs (NSAIDs). SEEK IMMEDIATE MEDICAL CARE IF:   You have pain in your arms, neck, jaw, teeth, or back.  Your pain increases or changes in intensity or duration.  You develop nausea, vomiting, or sweating (diaphoresis).  You develop shortness of breath, or you faint.  Your vomit is green, yellow, black, or looks like coffee grounds or blood.  Your stool is red, bloody, or black. These symptoms could be signs of other problems, such as heart disease, gastric bleeding, or esophageal bleeding. MAKE SURE YOU:   Understand these instructions.  Will watch your condition.  Will get help right away if you are not doing well or get worse. Document Released: 06/30/2005 Document Revised: 12/13/2011 Document Reviewed: 04/09/2011 [facility] Patient Information 2014 [place], [place].

## 2014-02-28 NOTE — Assessment & Plan Note (Signed)
Well controlled, no changes to meds. Encouraged heart healthy diet such as the DASH diet and exercise as tolerated.  °

## 2014-03-01 ENCOUNTER — Telehealth: Payer: Self-pay | Admitting: Family Medicine

## 2014-03-01 LAB — TSH: TSH: 0.448 u[IU]/mL (ref 0.350–4.500)

## 2014-03-01 LAB — H. PYLORI ANTIBODY, IGG

## 2014-03-01 NOTE — Telephone Encounter (Signed)
Relevant patient education mailed to patient.  

## 2014-03-03 ENCOUNTER — Encounter: Payer: Self-pay | Admitting: Family Medicine

## 2014-03-03 DIAGNOSIS — K219 Gastro-esophageal reflux disease without esophagitis: Secondary | ICD-10-CM | POA: Insufficient documentation

## 2014-03-03 HISTORY — DX: Gastro-esophageal reflux disease without esophagitis: K21.9

## 2014-03-03 NOTE — Assessment & Plan Note (Signed)
Avoid offending foods, start probiotics. Do not eat large meals in late evening and consider raising head of bed.  
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
## 2014-03-12 ENCOUNTER — Ambulatory Visit (INDEPENDENT_AMBULATORY_CARE_PROVIDER_SITE_OTHER): Payer: Medicare Other | Admitting: Family Medicine

## 2014-03-12 ENCOUNTER — Encounter: Payer: Self-pay | Admitting: Family Medicine

## 2014-03-12 VITALS — BP 142/84 | HR 75 | Temp 97.8°F | Ht 64.0 in | Wt 141.0 lb

## 2014-03-12 DIAGNOSIS — R1319 Other dysphagia: Secondary | ICD-10-CM

## 2014-03-12 DIAGNOSIS — E079 Disorder of thyroid, unspecified: Secondary | ICD-10-CM | POA: Diagnosis not present

## 2014-03-12 DIAGNOSIS — I10 Essential (primary) hypertension: Secondary | ICD-10-CM | POA: Diagnosis not present

## 2014-03-12 DIAGNOSIS — K219 Gastro-esophageal reflux disease without esophagitis: Secondary | ICD-10-CM

## 2014-03-12 DIAGNOSIS — E039 Hypothyroidism, unspecified: Secondary | ICD-10-CM

## 2014-03-12 DIAGNOSIS — E049 Nontoxic goiter, unspecified: Secondary | ICD-10-CM | POA: Diagnosis not present

## 2014-03-12 MED ORDER — ALPRAZOLAM 0.25 MG PO TABS: 0.2500 mg | ORAL_TABLET | Freq: Two times a day (BID) | ORAL | Status: DC | PRN

## 2014-03-12 NOTE — Progress Notes (Signed)
Pre visit review using our clinic review tool, if applicable. No additional management support is needed unless otherwise documented below in the visit note. 

## 2014-03-12 NOTE — Patient Instructions (Signed)
  Julie Henry's Colon Health probiotic daily  Gastroesophageal Reflux Disease, Adult Gastroesophageal reflux disease (GERD) happens when acid from your stomach flows up into the esophagus. When acid comes in contact with the esophagus, the acid causes soreness (inflammation) in the esophagus. Over time, GERD may create small holes (ulcers) in the lining of the esophagus. CAUSES   Increased body weight. This puts pressure on the stomach, making acid rise from the stomach into the esophagus.  Smoking. This increases acid production in the stomach.  Drinking alcohol. This causes decreased pressure in the lower esophageal sphincter (valve or ring of muscle between the esophagus and stomach), allowing acid from the stomach into the esophagus.  Late evening meals and a full stomach. This increases pressure and acid production in the stomach.  A malformed lower esophageal sphincter. Sometimes, no cause is found. SYMPTOMS   Burning pain in the lower part of the mid-chest behind the breastbone and in the mid-stomach area. This may occur twice a week or more often.  Trouble swallowing.  Sore throat.  Dry cough.  Asthma-like symptoms including chest tightness, shortness of breath, or wheezing. DIAGNOSIS  Your caregiver may be able to diagnose GERD based on your symptoms. In some cases, X-rays and other tests may be done to check for complications or to check the condition of your stomach and esophagus. TREATMENT  Your caregiver may recommend over-the-counter or prescription medicines to help decrease acid production. Ask your caregiver before starting or adding any new medicines.  HOME CARE INSTRUCTIONS   Change the factors that you can control. Ask your caregiver for guidance concerning weight loss, quitting smoking, and alcohol consumption.  Avoid foods and drinks that make your symptoms worse, such as:  Caffeine or alcoholic drinks.  Chocolate.  Peppermint or mint flavorings.  Garlic  and onions.  Spicy foods.  Citrus fruits, such as oranges, lemons, or limes.  Tomato-based foods such as sauce, chili, salsa, and pizza.  Fried and fatty foods.  Avoid lying down for the 3 hours prior to your bedtime or prior to taking a nap.  Eat small, frequent meals instead of large meals.  Wear loose-fitting clothing. Do not wear anything tight around your waist that causes pressure on your stomach.  Raise the head of your bed 6 to 8 inches with wood blocks to help you sleep. Extra pillows will not help.  Only take over-the-counter or prescription medicines for pain, discomfort, or fever as directed by your caregiver.  Do not take aspirin, ibuprofen, or other nonsteroidal anti-inflammatory drugs (NSAIDs). SEEK IMMEDIATE MEDICAL CARE IF:   You have pain in your arms, neck, jaw, teeth, or back.  Your pain increases or changes in intensity or duration.  You develop nausea, vomiting, or sweating (diaphoresis).  You develop shortness of breath, or you faint.  Your vomit is green, yellow, black, or looks like coffee grounds or blood.  Your stool is red, bloody, or black. These symptoms could be signs of other problems, such as heart disease, gastric bleeding, or esophageal bleeding. MAKE SURE YOU:   Understand these instructions.  Will watch your condition.  Will get help right away if you are not doing well or get worse. Document Released: 06/30/2005 Document Revised: 12/13/2011 Document Reviewed: 04/09/2011 [facility] Patient Information 2014 [place], [place].

## 2014-03-13 LAB — THYROID PEROXIDASE ANTIBODY

## 2014-03-13 LAB — THYROGLOBULIN LEVEL: THYROGLOBULIN: 140 ng/mL — AB (ref 0.0–55.0)

## 2014-03-13 LAB — T4, FREE: Free T4: 1.41 ng/dL (ref 0.80–1.80)

## 2014-03-13 LAB — T3, FREE: T3, Free: 3.3 pg/mL (ref 2.3–4.2)

## 2014-03-14 ENCOUNTER — Telehealth: Payer: Self-pay

## 2014-03-14 ENCOUNTER — Encounter: Payer: Self-pay | Admitting: Family Medicine

## 2014-03-14 NOTE — Telephone Encounter (Signed)
Patient left a message yesterday stating that she is looking at her thyroid results and doesn't understand?  One number (thyroglubin) is high?  And the thyroid antibody is 10 (which it needs to be below 39)  Please advise?

## 2014-03-15 ENCOUNTER — Other Ambulatory Visit: Payer: Self-pay

## 2014-03-15 NOTE — Telephone Encounter (Signed)
Spoke with Julie Henry, advised message from Dr Charlett Blake. Julie Henry understood and will discuss with Dr Dwyane Dee at her appointment.

## 2014-03-15 NOTE — Telephone Encounter (Signed)
Spoke with pharmacist at [facility] and no Rx was sent. I called in Rx.

## 2014-03-15 NOTE — Telephone Encounter (Signed)
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 The antibodies and more worrisome labs were normal so follow up with endocrinology and they will further evaluate

## 2014-03-18 ENCOUNTER — Ambulatory Visit (INDEPENDENT_AMBULATORY_CARE_PROVIDER_SITE_OTHER)
Admission: RE | Admit: 2014-03-18 | Discharge: 2014-03-18 | Disposition: A | Discharge status: Home / Self Care | Payer: Medicare Other | Source: Ambulatory Visit | Attending: Family Medicine | Admitting: Family Medicine

## 2014-03-18 DIAGNOSIS — Z780 Asymptomatic menopausal state: Secondary | ICD-10-CM

## 2014-03-20 ENCOUNTER — Encounter: Payer: Self-pay | Admitting: Family Medicine

## 2014-03-20 DIAGNOSIS — R1319 Other dysphagia: Secondary | ICD-10-CM

## 2014-03-20 HISTORY — DX: Other dysphagia: R13.19
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
## 2014-03-20 NOTE — Assessment & Plan Note (Signed)
Encouraged to avoid offending foods, use Tums prior to bedtime and if no improvement then start Zantac and will need evaluation at GI

## 2014-03-20 NOTE — Assessment & Plan Note (Addendum)
Recurrent multinodular goiter. Thyroid labs wnl except thyroglobulin elevated. Referred to endocrinology for further consideration secondary to patient's frustration with recurrent growth of hormone.

## 2014-03-20 NOTE — Assessment & Plan Note (Signed)
Avoid offending foods, start probiotics. Do not eat large meals in late evening and consider raising head of bed.  

## 2014-03-20 NOTE — Assessment & Plan Note (Signed)
Well controlled, no changes to meds. Encouraged heart healthy diet such as the DASH diet and exercise as tolerated.  °

## 2014-03-27 ENCOUNTER — Encounter: Payer: Self-pay | Admitting: Endocrinology

## 2014-03-27 ENCOUNTER — Ambulatory Visit (INDEPENDENT_AMBULATORY_CARE_PROVIDER_SITE_OTHER): Payer: Medicare Other | Admitting: Endocrinology

## 2014-03-27 VITALS — BP 138/76 | HR 83 | Temp 98.2°F | Resp 14 | Ht 64.0 in | Wt 138.0 lb

## 2014-03-27 DIAGNOSIS — R1310 Dysphagia, unspecified: Secondary | ICD-10-CM | POA: Diagnosis not present

## 2014-03-27 DIAGNOSIS — E042 Nontoxic multinodular goiter: Secondary | ICD-10-CM | POA: Diagnosis not present
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
## 2014-04-11 ENCOUNTER — Telehealth: Payer: Self-pay

## 2014-04-11 NOTE — Telephone Encounter (Signed)
It states [facility] called and gave RX to pharmacist?  I called and spoke to [place] and she states there was no refills called in.  Cyril Mourning took verbal order and will contact pt

## 2014-04-30 ENCOUNTER — Other Ambulatory Visit: Payer: Self-pay | Admitting: Family Medicine

## 2014-04-30 ENCOUNTER — Ambulatory Visit (INDEPENDENT_AMBULATORY_CARE_PROVIDER_SITE_OTHER): Payer: Medicare Other | Admitting: Family Medicine

## 2014-04-30 ENCOUNTER — Encounter: Payer: Self-pay | Admitting: Family Medicine

## 2014-04-30 VITALS — BP 146/80 | HR 85 | Temp 98.5°F | Ht 64.0 in | Wt 136.1 lb

## 2014-04-30 DIAGNOSIS — I10 Essential (primary) hypertension: Secondary | ICD-10-CM

## 2014-04-30 DIAGNOSIS — J449 Chronic obstructive pulmonary disease, unspecified: Secondary | ICD-10-CM

## 2014-04-30 DIAGNOSIS — F329 Major depressive disorder, single episode, unspecified: Secondary | ICD-10-CM

## 2014-04-30 DIAGNOSIS — M79609 Pain in unspecified limb: Secondary | ICD-10-CM | POA: Diagnosis not present

## 2014-04-30 DIAGNOSIS — F32A Depression, unspecified: Secondary | ICD-10-CM

## 2014-04-30 DIAGNOSIS — M79645 Pain in left finger(s): Secondary | ICD-10-CM

## 2014-04-30 DIAGNOSIS — F419 Anxiety disorder, unspecified: Secondary | ICD-10-CM

## 2014-04-30 DIAGNOSIS — R1319 Other dysphagia: Secondary | ICD-10-CM

## 2014-04-30 DIAGNOSIS — E785 Hyperlipidemia, unspecified: Secondary | ICD-10-CM

## 2014-04-30 DIAGNOSIS — F341 Dysthymic disorder: Secondary | ICD-10-CM | POA: Diagnosis not present

## 2014-04-30 DIAGNOSIS — K219 Gastro-esophageal reflux disease without esophagitis: Secondary | ICD-10-CM

## 2014-04-30 MED ORDER — ALPRAZOLAM 0.25 MG PO TABS: 0.2500 mg | ORAL_TABLET | Freq: Two times a day (BID) | ORAL | Status: DC | PRN

## 2014-04-30 MED ORDER — ESCITALOPRAM OXALATE 10 MG PO TABS: 10.0000 mg | ORAL_TABLET | Freq: Every day | ORAL | Status: DC

## 2014-04-30 NOTE — Progress Notes (Signed)
Pre visit review using our clinic review tool, if applicable. No additional management support is needed unless otherwise documented below in the visit note. 

## 2014-04-30 NOTE — Patient Instructions (Addendum)
Vitamin D with next  Gastroesophageal Reflux Disease, Adult Gastroesophageal reflux disease (GERD) happens when acid from your stomach flows up into the esophagus. When acid comes in contact with the esophagus, the acid causes soreness (inflammation) in the esophagus. Over time, GERD may create small holes (ulcers) in the lining of the esophagus. CAUSES   Increased body weight. This puts pressure on the stomach, making acid rise from the stomach into the esophagus.  Smoking. This increases acid production in the stomach.  Drinking alcohol. This causes decreased pressure in the lower esophageal sphincter (valve or ring of muscle between the esophagus and stomach), allowing acid from the stomach into the esophagus.  Late evening meals and a full stomach. This increases pressure and acid production in the stomach.  A malformed lower esophageal sphincter. Sometimes, no cause is found. SYMPTOMS   Burning pain in the lower part of the mid-chest behind the breastbone and in the mid-stomach area. This may occur twice a week or more often.  Trouble swallowing.  Sore throat.  Dry cough.  Asthma-like symptoms including chest tightness, shortness of breath, or wheezing. DIAGNOSIS  Your caregiver may be able to diagnose GERD based on your symptoms. In some cases, X-rays and other tests may be done to check for complications or to check the condition of your stomach and esophagus. TREATMENT  Your caregiver may recommend over-the-counter or prescription medicines to help decrease acid production. Ask your caregiver before starting or adding any new medicines.  HOME CARE INSTRUCTIONS   Change the factors that you can control. Ask your caregiver for guidance concerning weight loss, quitting smoking, and alcohol consumption.  Avoid foods and drinks that make your symptoms worse, such as:  Caffeine or alcoholic drinks.  Chocolate.  Peppermint or mint flavorings.  Garlic and onions.  Spicy  foods.  Citrus fruits, such as oranges, lemons, or limes.  Tomato-based foods such as sauce, chili, salsa, and pizza.  Fried and fatty foods.  Avoid lying down for the 3 hours prior to your bedtime or prior to taking a nap.  Eat small, frequent meals instead of large meals.  Wear loose-fitting clothing. Do not wear anything tight around your waist that causes pressure on your stomach.  Raise the head of your bed 6 to 8 inches with wood blocks to help you sleep. Extra pillows will not help.  Only take over-the-counter or prescription medicines for pain, discomfort, or fever as directed by your caregiver.  Do not take aspirin, ibuprofen, or other nonsteroidal anti-inflammatory drugs (NSAIDs). SEEK IMMEDIATE MEDICAL CARE IF:   You have pain in your arms, neck, jaw, teeth, or back.  Your pain increases or changes in intensity or duration.  You develop nausea, vomiting, or sweating (diaphoresis).  You develop shortness of breath, or you faint.  Your vomit is green, yellow, black, or looks like coffee grounds or blood.  Your stool is red, bloody, or black. These symptoms could be signs of other problems, such as heart disease, gastric bleeding, or esophageal bleeding. MAKE SURE YOU:   Understand these instructions.  Will watch your condition.  Will get help right away if you are not doing well or get worse. Document Released: 06/30/2005 Document Revised: 12/13/2011 Document Reviewed: 04/09/2011 [facility] Patient Information 2015 [place], [place]. This information is not intended to replace advice given to you by your health care provider. Make sure you discuss any questions you have with your health care provider.

## 2014-05-06 ENCOUNTER — Encounter: Payer: Self-pay | Admitting: Family Medicine

## 2014-05-06 DIAGNOSIS — M1990 Unspecified osteoarthritis, unspecified site: Secondary | ICD-10-CM | POA: Insufficient documentation

## 2014-05-06 HISTORY — DX: Unspecified osteoarthritis, unspecified site: M19.90

## 2014-05-06 NOTE — Assessment & Plan Note (Signed)
Improved with recheck, minimize caffeine and sodium

## 2014-05-06 NOTE — Assessment & Plan Note (Signed)
Avoid offending foods, start probiotics. Do not eat large meals in late evening and consider raising head of bed. Consider referral to GI for further consideration if worsens

## 2014-05-06 NOTE — Assessment & Plan Note (Signed)
Check uric acid encouraged topical treatments for now.

## 2014-05-06 NOTE — Assessment & Plan Note (Signed)
Avoid offending foods, start probiotics. Do not eat large meals in late evening and consider raising head of bed.  
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
## 2014-05-06 NOTE — Assessment & Plan Note (Signed)
Encouraged heart healthy diet, increase exercise, avoid trans fats, consider a krill oil cap daily 

## 2014-05-06 NOTE — Assessment & Plan Note (Signed)
Unfortunately smoking more, encouraged complete cessation

## 2014-05-07 ENCOUNTER — Ambulatory Visit (HOSPITAL_COMMUNITY): Payer: Medicare Other

## 2014-05-08 ENCOUNTER — Encounter (HOSPITAL_COMMUNITY)
Admission: RE | Admit: 2014-05-08 | Discharge: 2014-05-08 | Disposition: A | Discharge status: Home / Self Care | Payer: Medicare Other | Source: Ambulatory Visit | Attending: Diagnostic Radiology | Admitting: Diagnostic Radiology

## 2014-05-08 ENCOUNTER — Encounter (HOSPITAL_COMMUNITY): Payer: Medicare Other

## 2014-05-08 DIAGNOSIS — E041 Nontoxic single thyroid nodule: Secondary | ICD-10-CM | POA: Insufficient documentation

## 2014-05-08 DIAGNOSIS — E042 Nontoxic multinodular goiter: Secondary | ICD-10-CM

## 2014-05-08 DIAGNOSIS — E0789 Other specified disorders of thyroid: Secondary | ICD-10-CM | POA: Insufficient documentation

## 2014-05-08 MED ORDER — SODIUM IODIDE I 131 CAPSULE: 11.9000 | Freq: Once | INTRAVENOUS | Status: AC | PRN

## 2014-05-09 ENCOUNTER — Encounter (HOSPITAL_COMMUNITY)
Admission: RE | Admit: 2014-05-09 | Discharge: 2014-05-09 | Disposition: A | Discharge status: Home / Self Care | Payer: Medicare Other | Source: Ambulatory Visit | Attending: Endocrinology | Admitting: Endocrinology

## 2014-05-09 DIAGNOSIS — E042 Nontoxic multinodular goiter: Secondary | ICD-10-CM | POA: Diagnosis not present

## 2014-05-09 DIAGNOSIS — E041 Nontoxic single thyroid nodule: Secondary | ICD-10-CM | POA: Diagnosis not present

## 2014-05-09 DIAGNOSIS — E0789 Other specified disorders of thyroid: Secondary | ICD-10-CM | POA: Diagnosis not present

## 2014-05-09 MED ORDER — SODIUM PERTECHNETATE TC 99M INJECTION
10.0000 | Freq: Once | INTRAVENOUS | Status: AC | PRN
  Administered 2014-05-09: 10 via INTRAVENOUS

## 2014-05-13 ENCOUNTER — Other Ambulatory Visit (INDEPENDENT_AMBULATORY_CARE_PROVIDER_SITE_OTHER): Payer: Medicare Other

## 2014-05-13 ENCOUNTER — Other Ambulatory Visit: Payer: Self-pay | Admitting: *Deleted

## 2014-05-13 DIAGNOSIS — E049 Nontoxic goiter, unspecified: Secondary | ICD-10-CM | POA: Diagnosis not present

## 2014-05-13 LAB — TSH: TSH: 0.44 u[IU]/mL (ref 0.35–4.50)

## 2014-05-13 LAB — T4, FREE: Free T4: 1.1 ng/dL (ref 0.60–1.60)

## 2014-05-14 ENCOUNTER — Ambulatory Visit (INDEPENDENT_AMBULATORY_CARE_PROVIDER_SITE_OTHER): Payer: Medicare Other | Admitting: Endocrinology

## 2014-05-14 ENCOUNTER — Encounter: Payer: Self-pay | Admitting: Endocrinology

## 2014-05-14 VITALS — BP 141/80 | HR 88 | Temp 98.2°F | Resp 14 | Ht 64.0 in | Wt 136.2 lb

## 2014-05-14 DIAGNOSIS — E042 Nontoxic multinodular goiter: Secondary | ICD-10-CM | POA: Diagnosis not present

## 2014-05-14 DIAGNOSIS — R1319 Other dysphagia: Secondary | ICD-10-CM | POA: Diagnosis not present
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
## 2014-05-20 DIAGNOSIS — Z01419 Encounter for gynecological examination (general) (routine) without abnormal findings: Secondary | ICD-10-CM | POA: Diagnosis not present

## 2014-05-20 DIAGNOSIS — Z1151 Encounter for screening for human papillomavirus (HPV): Secondary | ICD-10-CM | POA: Diagnosis not present

## 2014-05-20 DIAGNOSIS — Z124 Encounter for screening for malignant neoplasm of cervix: Secondary | ICD-10-CM | POA: Diagnosis not present

## 2014-05-21 DIAGNOSIS — Z01419 Encounter for gynecological examination (general) (routine) without abnormal findings: Secondary | ICD-10-CM | POA: Diagnosis not present

## 2014-05-24 ENCOUNTER — Other Ambulatory Visit: Payer: Self-pay | Admitting: Family Medicine

## 2014-05-24 NOTE — Telephone Encounter (Signed)
Informed patient of medication refill and she scheduled appointment for 07/02/14

## 2014-05-24 NOTE — Telephone Encounter (Signed)
Rx sent to pharmacy. Pt is due for f/u. Please call to schedule. Thank you. LDM

## 2014-06-13 ENCOUNTER — Telehealth: Payer: Self-pay | Admitting: *Deleted

## 2014-06-13 NOTE — Telephone Encounter (Signed)
Pt had side effects, stomach cramps/pain, nausea from taking escitalopram (LEXAPRO) 10 MG tablet so she took herself off.  She ask to please not prescribe it to her anymore.

## 2014-06-14 NOTE — Telephone Encounter (Signed)
fyi

## 2014-06-14 NOTE — Telephone Encounter (Signed)
Please put it in allergies as an intolerance

## 2014-07-01 ENCOUNTER — Encounter: Payer: Self-pay | Admitting: Family Medicine

## 2014-07-01 ENCOUNTER — Ambulatory Visit (INDEPENDENT_AMBULATORY_CARE_PROVIDER_SITE_OTHER): Payer: Medicare Other | Admitting: Family Medicine

## 2014-07-01 VITALS — BP 122/80 | HR 92 | Temp 98.5°F | Ht 64.0 in | Wt 136.0 lb

## 2014-07-01 DIAGNOSIS — K219 Gastro-esophageal reflux disease without esophagitis: Secondary | ICD-10-CM | POA: Diagnosis not present

## 2014-07-01 DIAGNOSIS — J449 Chronic obstructive pulmonary disease, unspecified: Secondary | ICD-10-CM

## 2014-07-01 DIAGNOSIS — I10 Essential (primary) hypertension: Secondary | ICD-10-CM | POA: Diagnosis not present

## 2014-07-01 DIAGNOSIS — G4700 Insomnia, unspecified: Secondary | ICD-10-CM | POA: Diagnosis not present

## 2014-07-01 DIAGNOSIS — E7800 Pure hypercholesterolemia, unspecified: Secondary | ICD-10-CM

## 2014-07-01 DIAGNOSIS — F4310 Post-traumatic stress disorder, unspecified: Secondary | ICD-10-CM

## 2014-07-01 NOTE — Patient Instructions (Signed)
Encouraged good sleep hygiene such as dark, quiet room. No blue/green glowing lights such as computer screens in bedroom. No alcohol or stimulants in evening. Cut down on caffeine as able. Regular exercise is helpful but not just prior to bed time. Consider Melatonin 5-10 mg or L Tyrptophan 1/2 before bedtime.   Insomnia Insomnia is frequent trouble falling and/or staying asleep. Insomnia can be a long term problem or a short term problem. Both are common. Insomnia can be a short term problem when the wakefulness is related to a certain stress or worry. Long term insomnia is often related to ongoing stress during waking hours and/or poor sleeping habits. Overtime, sleep deprivation itself can make the problem worse. Every little thing feels more severe because you are overtired and your ability to cope is decreased. CAUSES   Stress, anxiety, and depression.  Poor sleeping habits.  Distractions such as TV in the bedroom.  Naps close to bedtime.  Engaging in emotionally charged conversations before bed.  Technical reading before sleep.  Alcohol and other sedatives. They may make the problem worse. They can hurt normal sleep patterns and normal dream activity.  Stimulants such as caffeine for several hours prior to bedtime.  Pain syndromes and shortness of breath can cause insomnia.  Exercise late at night.  Changing time zones may cause sleeping problems (jet lag). It is sometimes helpful to have someone observe your sleeping patterns. They should look for periods of not breathing during the night (sleep apnea). They should also look to see how long those periods last. If you live alone or observers are uncertain, you can also be observed at a sleep clinic where your sleep patterns will be professionally monitored. Sleep apnea requires a checkup and treatment. Give your caregivers your medical history. Give your caregivers observations your family has made about your sleep.  SYMPTOMS    Not feeling rested in the morning.  Anxiety and restlessness at bedtime.  Difficulty falling and staying asleep. TREATMENT   Your caregiver may prescribe treatment for an underlying medical disorders. Your caregiver can give advice or help if you are using alcohol or other drugs for self-medication. Treatment of underlying problems will usually eliminate insomnia problems.  Medications can be prescribed for short time use. They are generally not recommended for lengthy use.  Over-the-counter sleep medicines are not recommended for lengthy use. They can be habit forming.  You can promote easier sleeping by making lifestyle changes such as:  Using relaxation techniques that help with breathing and reduce muscle tension.  Exercising earlier in the day.  Changing your diet and the time of your last meal. No night time snacks.  Establish a regular time to go to bed.  Counseling can help with stressful problems and worry.  Soothing music and white noise may be helpful if there are background noises you cannot remove.  Stop tedious detailed work at least one hour before bedtime. HOME CARE INSTRUCTIONS   Keep a diary. Inform your caregiver about your progress. This includes any medication side effects. See your caregiver regularly. Take note of:  Times when you are asleep.  Times when you are awake during the night.  The quality of your sleep.  How you feel the next day. This information will help your caregiver care for you.  Get out of bed if you are still awake after 15 minutes. Read or do some quiet activity. Keep the lights down. Wait until you feel sleepy and go back to bed.  Keep  regular sleeping and waking hours. Avoid naps.  Exercise regularly.  Avoid distractions at bedtime. Distractions include watching television or engaging in any intense or detailed activity like attempting to balance the household checkbook.  Develop a bedtime ritual. Keep a familiar  routine of bathing, brushing your teeth, climbing into bed at the same time each night, listening to soothing music. Routines increase the success of falling to sleep faster.  Use relaxation techniques. This can be using breathing and muscle tension release routines. It can also include visualizing peaceful scenes. You can also help control troubling or intruding thoughts by keeping your mind occupied with boring or repetitive thoughts like the old concept of counting sheep. You can make it more creative like imagining planting one beautiful flower after another in your backyard garden.  During your day, work to eliminate stress. When this is not possible use some of the previous suggestions to help reduce the anxiety that accompanies stressful situations. MAKE SURE YOU:   Understand these instructions.  Will watch your condition.  Will get help right away if you are not doing well or get worse. Document Released: 09/17/2000 Document Revised: 12/13/2011 Document Reviewed: 10/18/2007 [facility] Patient Information 2015 [place], [place]. This information is not intended to replace advice given to you by your health care provider. Make sure you discuss any questions you have with your health care provider.
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
## 2014-07-01 NOTE — Assessment & Plan Note (Signed)
Well controlled, no changes to meds. Encouraged heart healthy diet such as the DASH diet and exercise as tolerated.  °

## 2014-07-01 NOTE — Assessment & Plan Note (Signed)
Encouraged good sleep hygiene such as dark, quiet room. No blue/green glowing lights such as computer screens in bedroom. No alcohol or stimulants in evening. Cut down on caffeine as able. Regular exercise is helpful but not just prior to bed time.  

## 2014-07-01 NOTE — Assessment & Plan Note (Signed)
Avoid offending foods, start probiotics. Do not eat large meals in late evening and consider raising head of bed.  

## 2014-07-01 NOTE — Progress Notes (Signed)
Pre visit review using our clinic review tool, if applicable. No additional management support is needed unless otherwise documented below in the visit note. 

## 2014-07-01 NOTE — Assessment & Plan Note (Signed)
Still smoking a couple cigarettes daily. Encouraged complete cessation. Discussed need to quit as relates to risk of numerous cancers, cardiac and pulmonary disease as well as neurologic complications. Counseled for greater than 3 minutes

## 2014-07-01 NOTE — Assessment & Plan Note (Signed)
Did not tolerate Lexapro, she reports myalgias.

## 2014-07-02 ENCOUNTER — Ambulatory Visit: Payer: Medicare Other | Admitting: Family Medicine

## 2014-07-09 ENCOUNTER — Telehealth: Payer: Self-pay | Admitting: *Deleted

## 2014-07-09 ENCOUNTER — Other Ambulatory Visit (INDEPENDENT_AMBULATORY_CARE_PROVIDER_SITE_OTHER): Payer: Medicare Other

## 2014-07-09 DIAGNOSIS — M1990 Unspecified osteoarthritis, unspecified site: Secondary | ICD-10-CM

## 2014-07-09 DIAGNOSIS — E7800 Pure hypercholesterolemia, unspecified: Secondary | ICD-10-CM

## 2014-07-09 LAB — LIPID PANEL
Cholesterol: 247 mg/dL — ABNORMAL HIGH (ref 0–200)
HDL: 41.8 mg/dL (ref >39.00)
LDL CALC: 177 mg/dL — AB (ref 0–99)
NonHDL: 205.2
TRIGLYCERIDES: 143 mg/dL (ref 0.0–149.0)
Total CHOL/HDL Ratio: 6
VLDL: 28.6 mg/dL (ref 0.0–40.0)

## 2014-07-09 NOTE — Telephone Encounter (Signed)
Pt requesting additional labs for her arthritis. Pt currently has lipid ordered only .

## 2014-07-09 NOTE — Telephone Encounter (Signed)
Please add these

## 2014-07-09 NOTE — Telephone Encounter (Signed)
OK to add RF, ANA, sed rate for hand pain

## 2014-07-10 DIAGNOSIS — M1990 Unspecified osteoarthritis, unspecified site: Secondary | ICD-10-CM | POA: Diagnosis not present

## 2014-07-10 DIAGNOSIS — M129 Arthropathy, unspecified: Secondary | ICD-10-CM | POA: Diagnosis not present

## 2014-07-10 LAB — RHEUMATOID FACTOR

## 2014-07-10 NOTE — Telephone Encounter (Signed)
RF & ANA ordered, pt has to come back to have sed rate done.

## 2014-07-10 NOTE — Telephone Encounter (Signed)
LMOM with contact name and number for return call RE: scheduling lab appointment to have Sed Rate lab drawn, as we were unable to add this lab to prior blood work drawn/SLS

## 2014-07-11 LAB — ANA: ANA: NEGATIVE

## 2014-07-11 NOTE — Telephone Encounter (Signed)
A user error has taken place.

## 2014-08-02 ENCOUNTER — Other Ambulatory Visit (INDEPENDENT_AMBULATORY_CARE_PROVIDER_SITE_OTHER): Payer: Medicare Other

## 2014-08-02 ENCOUNTER — Other Ambulatory Visit: Payer: Medicare Other

## 2014-08-02 DIAGNOSIS — M1990 Unspecified osteoarthritis, unspecified site: Secondary | ICD-10-CM

## 2014-08-02 LAB — SEDIMENTATION RATE: SED RATE: 34 mm/h — AB (ref 0–22)

## 2014-10-01 ENCOUNTER — Encounter: Payer: Self-pay | Admitting: Family Medicine

## 2014-10-01 ENCOUNTER — Ambulatory Visit (INDEPENDENT_AMBULATORY_CARE_PROVIDER_SITE_OTHER): Payer: Medicare Other | Admitting: Family Medicine

## 2014-10-01 VITALS — BP 142/74 | HR 79 | Temp 98.4°F | Ht 64.0 in | Wt 136.8 lb

## 2014-10-01 DIAGNOSIS — I10 Essential (primary) hypertension: Secondary | ICD-10-CM

## 2014-10-01 DIAGNOSIS — E782 Mixed hyperlipidemia: Secondary | ICD-10-CM

## 2014-10-01 DIAGNOSIS — F17210 Nicotine dependence, cigarettes, uncomplicated: Secondary | ICD-10-CM | POA: Diagnosis not present

## 2014-10-01 DIAGNOSIS — J441 Chronic obstructive pulmonary disease with (acute) exacerbation: Secondary | ICD-10-CM | POA: Diagnosis not present

## 2014-10-01 DIAGNOSIS — F17200 Nicotine dependence, unspecified, uncomplicated: Secondary | ICD-10-CM | POA: Insufficient documentation

## 2014-10-01 DIAGNOSIS — K219 Gastro-esophageal reflux disease without esophagitis: Secondary | ICD-10-CM | POA: Diagnosis not present

## 2014-10-01 DIAGNOSIS — M1990 Unspecified osteoarthritis, unspecified site: Secondary | ICD-10-CM

## 2014-10-01 HISTORY — DX: Nicotine dependence, unspecified, uncomplicated: F17.200

## 2014-10-01 MED ORDER — CEFDINIR 300 MG PO CAPS: 300.0000 mg | ORAL_CAPSULE | Freq: Two times a day (BID) | ORAL | Status: AC

## 2014-10-01 MED ORDER — METHYLPREDNISOLONE (PAK) 4 MG PO TABS: ORAL_TABLET | ORAL | Status: DC

## 2014-10-01 NOTE — Assessment & Plan Note (Signed)
Well controlled, no changes to meds. Encouraged heart healthy diet such as the DASH diet and exercise as tolerated.  °

## 2014-10-01 NOTE — Patient Instructions (Addendum)
Curcumen/turmeric caps daily Restart Krill oil  Encouraged increased rest and hydration, add probiotics such as Digestive Advantage or [name] , zinc such as Coldeze or Xicam. Treat fevers as needed. Plain Mucinex twice daily for 10 days Nasal saline twice a day and Flonase nose spray daily    Arthritis, Nonspecific Arthritis is inflammation of a joint. This usually means pain, redness, warmth or swelling are present. One or more joints may be involved. There are a number of types of arthritis. Your caregiver may not be able to tell what type of arthritis you have right away. CAUSES  The most common cause of arthritis is the wear and tear on the joint (osteoarthritis). This causes damage to the cartilage, which can break down over time. The knees, hips, back and neck are most often affected by this type of arthritis. Other types of arthritis and common causes of joint pain include:  Sprains and other injuries near the joint. Sometimes minor sprains and injuries cause pain and swelling that develop hours later.  Rheumatoid arthritis. This affects hands, feet and knees. It usually affects both sides of your body at the same time. It is often associated with chronic ailments, fever, weight loss and general weakness.  Crystal arthritis. Gout and pseudo gout can cause occasional acute severe pain, redness and swelling in the foot, ankle, or knee.  Infectious arthritis. Bacteria can get into a joint through a break in overlying skin. This can cause infection of the joint. Bacteria and viruses can also spread through the blood and affect your joints.  Drug, infectious and allergy reactions. Sometimes joints can become mildly painful and slightly swollen with these types of illnesses. SYMPTOMS   Pain is the main symptom.  Your joint or joints can also be red, swollen and warm or hot to the touch.  You may have a fever with certain types of arthritis, or even feel overall ill.  The joint with  arthritis will hurt with movement. Stiffness is present with some types of arthritis. DIAGNOSIS  Your caregiver will suspect arthritis based on your description of your symptoms and on your exam. Testing may be needed to find the type of arthritis:  Blood and sometimes urine tests.  X-ray tests and sometimes CT or MRI scans.  Removal of fluid from the joint (arthrocentesis) is done to check for bacteria, crystals or other causes. Your caregiver (or a specialist) will numb the area over the joint with a local anesthetic, and use a needle to remove joint fluid for examination. This procedure is only minimally uncomfortable.  Even with these tests, your caregiver may not be able to tell what kind of arthritis you have. Consultation with a specialist (rheumatologist) may be helpful. TREATMENT  Your caregiver will discuss with you treatment specific to your type of arthritis. If the specific type cannot be determined, then the following general recommendations may apply. Treatment of severe joint pain includes:  Rest.  Elevation.  Anti-inflammatory medication (for example, ibuprofen) may be prescribed. Avoiding activities that cause increased pain.  Only take over-the-counter or prescription medicines for pain and discomfort as recommended by your caregiver.  Cold packs over an inflamed joint may be used for 10 to 15 minutes every hour. Hot packs sometimes feel better, but do not use overnight. Do not use hot packs if you are diabetic without your caregiver's permission.  A cortisone shot into arthritic joints may help reduce pain and swelling.  Any acute arthritis that gets worse over the next 1  to 2 days needs to be looked at to be sure there is no joint infection. Long-term arthritis treatment involves modifying activities and lifestyle to reduce joint stress jarring. This can include weight loss. Also, exercise is needed to nourish the joint cartilage and remove waste. This helps keep the  muscles around the joint strong. HOME CARE INSTRUCTIONS   Do not take aspirin to relieve pain if gout is suspected. This elevates uric acid levels.  Only take over-the-counter or prescription medicines for pain, discomfort or fever as directed by your caregiver.  Rest the joint as much as possible.  If your joint is swollen, keep it elevated.  Use crutches if the painful joint is in your leg.  Drinking plenty of fluids may help for certain types of arthritis.  Follow your caregiver's dietary instructions.  Try low-impact exercise such as:  Swimming.  Water aerobics.  Biking.  Walking.  Morning stiffness is often relieved by a warm shower.  Put your joints through regular range-of-motion. SEEK MEDICAL CARE IF:   You do not feel better in 24 hours or are getting worse.  You have side effects to medications, or are not getting better with treatment. SEEK IMMEDIATE MEDICAL CARE IF:   You have a fever.  You develop severe joint pain, swelling or redness.  Many joints are involved and become painful and swollen.  There is severe back pain and/or leg weakness.  You have loss of bowel or bladder control. Document Released: 10/28/2004 Document Revised: 12/13/2011 Document Reviewed: 11/13/2008 [facility] Patient Information 2015 [place], [place]. This information is not intended to replace advice given to you by your health care provider. Make sure you discuss any questions you have with your health care provider.

## 2014-10-01 NOTE — Progress Notes (Signed)
Pre visit review using our clinic review tool, if applicable. No additional management support is needed unless otherwise documented below in the visit note. 

## 2014-10-01 NOTE — Assessment & Plan Note (Signed)
Avoid offending foods, start probiotics. Do not eat large meals in late evening and consider raising head of bed.  

## 2014-10-01 NOTE — Assessment & Plan Note (Signed)
Encouraged complete cessation. Discussed need to quit as relates to risk of numerous cancers, cardiac and pulmonary disease as well as neurologic complications. Counseled for greater than 3 minutes

## 2014-10-01 NOTE — Assessment & Plan Note (Signed)
Encouraged heart healthy diet, increase exercise, avoid trans fats, consider a krill oil cap daily 

## 2014-10-01 NOTE — Assessment & Plan Note (Signed)
Encouraged increased rest and hydration, add probiotics, zinc such as Coldeze or Xicam. Treat fevers as needed. Mucinex and Cefdinir, medrol dospak

## 2014-10-01 NOTE — Assessment & Plan Note (Signed)
B/l hands with swelling in b/l thenar prominences with elevated Sed Rate, offered referral to Rheumatology. Declines for now, add Curcumen and given a medrol dosepak.
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
## 2014-10-02 ENCOUNTER — Telehealth: Payer: Self-pay | Admitting: Family Medicine

## 2014-10-02 NOTE — Telephone Encounter (Signed)
Caller name: Dannya Relation to pt: self Call back number: [phone] Pharmacy:  Reason for call:   Patient states that cesdinir is too expensive(tier 3) and patient states that she needs tier 2 medication( cdfotaxime or cefpazidime). Patient states that she did pick up rx because she needed this but for future reference needs to be tier 2

## 2014-10-03 NOTE — Telephone Encounter (Signed)
Called and spoke with the pt and informed her of note below.  Pt verbalized understanding.//AB/CMA

## 2014-10-03 NOTE — Telephone Encounter (Signed)
She has to know I do not know whose insurance consider these tier 2 vs tier 3, every plan is different so she will just have to check with pharmacy and we will have to switch if it happens again

## 2014-10-10 ENCOUNTER — Emergency Department (HOSPITAL_BASED_OUTPATIENT_CLINIC_OR_DEPARTMENT_OTHER)
Admission: EM | Admit: 2014-10-10 | Discharge: 2014-10-10 | Disposition: A | Discharge status: Home / Self Care | Payer: Medicare Other | Attending: Emergency Medicine | Admitting: Emergency Medicine

## 2014-10-10 ENCOUNTER — Telehealth: Payer: Self-pay | Admitting: Family Medicine

## 2014-10-10 ENCOUNTER — Encounter (HOSPITAL_BASED_OUTPATIENT_CLINIC_OR_DEPARTMENT_OTHER): Payer: Self-pay | Admitting: Family Medicine

## 2014-10-10 DIAGNOSIS — T161XXA Foreign body in right ear, initial encounter: Secondary | ICD-10-CM | POA: Diagnosis not present

## 2014-10-10 DIAGNOSIS — Z792 Long term (current) use of antibiotics: Secondary | ICD-10-CM | POA: Diagnosis not present

## 2014-10-10 DIAGNOSIS — Z8719 Personal history of other diseases of the digestive system: Secondary | ICD-10-CM | POA: Insufficient documentation

## 2014-10-10 DIAGNOSIS — Y998 Other external cause status: Secondary | ICD-10-CM | POA: Insufficient documentation

## 2014-10-10 DIAGNOSIS — Z720 Tobacco use: Secondary | ICD-10-CM | POA: Diagnosis not present

## 2014-10-10 DIAGNOSIS — I10 Essential (primary) hypertension: Secondary | ICD-10-CM | POA: Diagnosis not present

## 2014-10-10 DIAGNOSIS — Z79899 Other long term (current) drug therapy: Secondary | ICD-10-CM | POA: Diagnosis not present

## 2014-10-10 DIAGNOSIS — J449 Chronic obstructive pulmonary disease, unspecified: Secondary | ICD-10-CM | POA: Insufficient documentation

## 2014-10-10 DIAGNOSIS — X58XXXA Exposure to other specified factors, initial encounter: Secondary | ICD-10-CM | POA: Insufficient documentation

## 2014-10-10 DIAGNOSIS — Y9389 Activity, other specified: Secondary | ICD-10-CM | POA: Diagnosis not present

## 2014-10-10 DIAGNOSIS — Z8739 Personal history of other diseases of the musculoskeletal system and connective tissue: Secondary | ICD-10-CM | POA: Insufficient documentation

## 2014-10-10 DIAGNOSIS — Z8659 Personal history of other mental and behavioral disorders: Secondary | ICD-10-CM | POA: Diagnosis not present

## 2014-10-10 DIAGNOSIS — Z8619 Personal history of other infectious and parasitic diseases: Secondary | ICD-10-CM | POA: Insufficient documentation

## 2014-10-10 DIAGNOSIS — Z8742 Personal history of other diseases of the female genital tract: Secondary | ICD-10-CM | POA: Insufficient documentation

## 2014-10-10 DIAGNOSIS — E785 Hyperlipidemia, unspecified: Secondary | ICD-10-CM | POA: Diagnosis not present

## 2014-10-10 DIAGNOSIS — Y9289 Other specified places as the place of occurrence of the external cause: Secondary | ICD-10-CM | POA: Diagnosis not present

## 2014-10-10 DIAGNOSIS — S00451A Superficial foreign body of right ear, initial encounter: Secondary | ICD-10-CM | POA: Diagnosis not present

## 2014-10-10 DIAGNOSIS — Z872 Personal history of diseases of the skin and subcutaneous tissue: Secondary | ICD-10-CM | POA: Diagnosis not present

## 2014-10-10 MED ORDER — TRIAMCINOLONE ACETONIDE 55 MCG/ACT NA AERO: 2.0000 | INHALATION_SPRAY | Freq: Every day | NASAL | Status: DC

## 2014-10-10 NOTE — Telephone Encounter (Signed)
Patient was seen in ED. 

## 2014-10-10 NOTE — Telephone Encounter (Signed)
Patient Name: Julie Henry  DOB: 1946-03-26    Nurse Assessment  Nurse: Vallery Sa, RN, Cathy Date/Time (Eastern Time): 10/10/2014 12:52:51 PM  Confirm and document reason for call. If symptomatic, describe symptoms. ---Caller states her artificial fingernail fell into her right ear this morning.  Has the patient traveled out of the country within the last 30 days? ---No  Does the patient require triage? ---Yes  Related visit to physician within the last 2 weeks? ---No  Does the PT have any chronic conditions? (i.e. diabetes, asthma, etc.) ---Yes  List chronic conditions. ---High Blood Pressure, Thyroid problems, High Cholesterol, COPD     Guidelines    Guideline Title Affirmed Question Affirmed Notes  Ear - Foreign Body MODERATE-SEVERE ear pain    Final Disposition User   Go to ED Now Vallery Sa, RN, [employer]
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
## 2014-10-10 NOTE — ED Notes (Signed)
Pt c/o a fake fingernail being stuck in her right ear since today.

## 2014-10-10 NOTE — Discharge Instructions (Signed)
Ear Foreign Body °An ear foreign body is an object that is stuck in the ear. It is common for young children to put objects into the ear canal. These may include pebbles, beads, beans, and any other small objects which will fit. In adults, objects such as cotton swabs may become lodged in the ear canal. In all ages, the most common foreign bodies are insects that enter the ear canal.  °SYMPTOMS  °Foreign bodies may cause pain, buzzing or roaring sounds, hearing loss, and ear drainage.  °HOME CARE INSTRUCTIONS  °· Keep all follow-up appointments with your caregiver as told. °· Keep small objects out of reach of young children. Tell them not to put anything in their ears. °SEEK IMMEDIATE MEDICAL CARE IF:  °· You have bleeding from the ear. °· You have increased pain or swelling of the ear. °· You have reduced hearing. °· You have discharge coming from the ear. °· You have a fever. °· You have a headache. °MAKE SURE YOU:  °· Understand these instructions. °· Will watch your condition. °· Will get help right away if you are not doing well or get worse. °Document Released: 09/17/2000 Document Revised: 12/13/2011 Document Reviewed: 05/08/2008 °[employer]® Patient Information ©2015 [employer]. This information is not intended to replace advice given to you by your health care provider. Make sure you discuss any questions you have with your health care provider. ° °

## 2014-10-24 ENCOUNTER — Telehealth: Payer: Self-pay | Admitting: Family Medicine

## 2014-10-24 MED ORDER — AMLODIPINE BESYLATE 5 MG PO TABS: 5.0000 mg | ORAL_TABLET | Freq: Every day | ORAL | Status: DC

## 2014-10-24 NOTE — Telephone Encounter (Signed)
Caller name:Alomar, Imya Relation to UI:QNVV Call back [phone] Pharmacy:[facility]  Reason for call: pt is needing rx   amLODipine (NORVASC) 5 MG tablet

## 2014-10-24 NOTE — Telephone Encounter (Signed)
Rx sent to the pharmacy by e-script.//AB/CMA 

## 2014-11-11 ENCOUNTER — Other Ambulatory Visit: Payer: Medicare Other

## 2014-11-14 ENCOUNTER — Ambulatory Visit: Payer: Medicare Other | Admitting: Endocrinology

## 2014-12-23 ENCOUNTER — Other Ambulatory Visit (INDEPENDENT_AMBULATORY_CARE_PROVIDER_SITE_OTHER): Payer: Medicare Other

## 2014-12-23 DIAGNOSIS — E042 Nontoxic multinodular goiter: Secondary | ICD-10-CM | POA: Diagnosis not present

## 2014-12-23 LAB — T4, FREE: Free T4: 1.01 ng/dL (ref 0.60–1.60)

## 2014-12-23 LAB — TSH: TSH: 0.68 u[IU]/mL (ref 0.35–4.50)

## 2014-12-26 ENCOUNTER — Ambulatory Visit (INDEPENDENT_AMBULATORY_CARE_PROVIDER_SITE_OTHER): Payer: Medicare Other | Admitting: Endocrinology

## 2014-12-26 ENCOUNTER — Encounter: Payer: Self-pay | Admitting: Endocrinology

## 2014-12-26 VITALS — BP 152/86 | HR 85 | Temp 98.0°F | Resp 14 | Ht 64.0 in | Wt 136.2 lb

## 2014-12-26 DIAGNOSIS — I10 Essential (primary) hypertension: Secondary | ICD-10-CM | POA: Diagnosis not present

## 2014-12-26 DIAGNOSIS — E042 Nontoxic multinodular goiter: Secondary | ICD-10-CM | POA: Diagnosis not present
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
## 2015-01-31 ENCOUNTER — Ambulatory Visit (INDEPENDENT_AMBULATORY_CARE_PROVIDER_SITE_OTHER): Payer: Medicare Other | Admitting: Family Medicine

## 2015-01-31 VITALS — BP 140/72 | HR 88 | Temp 99.0°F | Ht 64.0 in | Wt 138.5 lb

## 2015-01-31 DIAGNOSIS — E785 Hyperlipidemia, unspecified: Secondary | ICD-10-CM | POA: Diagnosis not present

## 2015-01-31 DIAGNOSIS — Z720 Tobacco use: Secondary | ICD-10-CM

## 2015-01-31 DIAGNOSIS — H53133 Sudden visual loss, bilateral: Secondary | ICD-10-CM

## 2015-01-31 DIAGNOSIS — I10 Essential (primary) hypertension: Secondary | ICD-10-CM

## 2015-01-31 DIAGNOSIS — F4310 Post-traumatic stress disorder, unspecified: Secondary | ICD-10-CM

## 2015-01-31 DIAGNOSIS — F419 Anxiety disorder, unspecified: Principal | ICD-10-CM

## 2015-01-31 DIAGNOSIS — F329 Major depressive disorder, single episode, unspecified: Secondary | ICD-10-CM

## 2015-01-31 DIAGNOSIS — J449 Chronic obstructive pulmonary disease, unspecified: Secondary | ICD-10-CM

## 2015-01-31 DIAGNOSIS — F17200 Nicotine dependence, unspecified, uncomplicated: Secondary | ICD-10-CM

## 2015-01-31 DIAGNOSIS — K219 Gastro-esophageal reflux disease without esophagitis: Secondary | ICD-10-CM | POA: Diagnosis not present

## 2015-01-31 DIAGNOSIS — F418 Other specified anxiety disorders: Secondary | ICD-10-CM | POA: Diagnosis not present

## 2015-01-31 DIAGNOSIS — E782 Mixed hyperlipidemia: Secondary | ICD-10-CM

## 2015-01-31 DIAGNOSIS — F32A Depression, unspecified: Secondary | ICD-10-CM

## 2015-01-31 DIAGNOSIS — G4700 Insomnia, unspecified: Secondary | ICD-10-CM

## 2015-01-31 LAB — CBC
HCT: 41.3 % (ref 36.0–46.0)
HEMOGLOBIN: 14.1 g/dL (ref 12.0–15.0)
MCHC: 34.1 g/dL (ref 30.0–36.0)
MCV: 87.1 fl (ref 78.0–100.0)
Platelets: 156 10*3/uL (ref 150.0–400.0)
RBC: 4.74 Mil/uL (ref 3.87–5.11)
RDW: 14.4 % (ref 11.5–15.5)
WBC: 8.6 10*3/uL (ref 4.0–10.5)

## 2015-01-31 LAB — COMPLETE METABOLIC PANEL WITH GFR
ALT: 11 U/L (ref 0–35)
AST: 17 U/L (ref 0–37)
Albumin: 3.9 g/dL (ref 3.5–5.2)
Alkaline Phosphatase: 82 U/L (ref 39–117)
BILIRUBIN TOTAL: 0.4 mg/dL (ref 0.2–1.2)
BUN: 22 mg/dL (ref 6–23)
CALCIUM: 9.1 mg/dL (ref 8.4–10.5)
CHLORIDE: 105 meq/L (ref 96–112)
CO2: 23 meq/L (ref 19–32)
CREATININE: 0.62 mg/dL (ref 0.50–1.10)
GFR, Est African American: >89 mL/min
GFR, Est Non African American: >89 mL/min
Glucose, Bld: 84 mg/dL (ref 70–99)
Potassium: 4.1 mEq/L (ref 3.5–5.3)
SODIUM: 140 meq/L (ref 135–145)
TOTAL PROTEIN: 6.5 g/dL (ref 6.0–8.3)

## 2015-01-31 LAB — LIPID PANEL
CHOL/HDL RATIO: 5
Cholesterol: 238 mg/dL — ABNORMAL HIGH (ref 0–200)
HDL: 48.7 mg/dL (ref >39.00)
LDL Cholesterol: 153 mg/dL — ABNORMAL HIGH (ref 0–99)
NonHDL: 189.3
TRIGLYCERIDES: 182 mg/dL — AB (ref 0.0–149.0)
VLDL: 36.4 mg/dL (ref 0.0–40.0)

## 2015-01-31 MED ORDER — AMLODIPINE BESYLATE 5 MG PO TABS: 5.0000 mg | ORAL_TABLET | Freq: Every day | ORAL | Status: AC

## 2015-01-31 MED ORDER — ALPRAZOLAM 0.25 MG PO TABS: 0.2500 mg | ORAL_TABLET | Freq: Two times a day (BID) | ORAL | Status: DC | PRN

## 2015-01-31 NOTE — Patient Instructions (Addendum)
Melatonin 2-10 mg at bedtime.  Rel of rec Dr [facility] [place]    Hypertension Hypertension, commonly called high blood pressure, is when the force of blood pumping through your arteries is too strong. Your arteries are the blood vessels that carry blood from your heart throughout your body. A blood pressure reading consists of a higher number over a lower number, such as 110/72. The higher number (systolic) is the pressure inside your arteries when your heart pumps. The lower number (diastolic) is the pressure inside your arteries when your heart relaxes. Ideally you want your blood pressure below 120/80. Hypertension forces your heart to work harder to pump blood. Your arteries may become narrow or stiff. Having hypertension puts you at risk for heart disease, stroke, and other problems.  RISK FACTORS Some risk factors for high blood pressure are controllable. Others are not.  Risk factors you cannot control include:   Race. You may be at higher risk if you are African American.  Age. Risk increases with age.  Gender. Men are at higher risk than women before age 39 years. After age 23, women are at higher risk than men. Risk factors you can control include:  Not getting enough exercise or physical activity.  Being overweight.  Getting too much fat, sugar, calories, or salt in your diet.  Drinking too much alcohol. SIGNS AND SYMPTOMS Hypertension does not usually cause signs or symptoms. Extremely high blood pressure (hypertensive crisis) may cause headache, anxiety, shortness of breath, and nosebleed. DIAGNOSIS  To check if you have hypertension, your health care provider will measure your blood pressure while you are seated, with your arm held at the level of your heart. It should be measured at least twice using the same arm. Certain conditions can cause a difference in blood pressure between your right and left arms. A blood pressure reading that is higher than normal on one  occasion does not mean that you need treatment. If one blood pressure reading is high, ask your health care provider about having it checked again. TREATMENT  Treating high blood pressure includes making lifestyle changes and possibly taking medicine. Living a healthy lifestyle can help lower high blood pressure. You may need to change some of your habits. Lifestyle changes may include:  Following the DASH diet. This diet is high in fruits, vegetables, and whole grains. It is low in salt, red meat, and added sugars.  Getting at least 2 hours of brisk physical activity every week.  Losing weight if necessary.  Not smoking.  Limiting alcoholic beverages.  Learning ways to reduce stress. If lifestyle changes are not enough to get your blood pressure under control, your health care provider may prescribe medicine. You may need to take more than one. Work closely with your health care provider to understand the risks and benefits. HOME CARE INSTRUCTIONS  Have your blood pressure rechecked as directed by your health care provider.   Take medicines only as directed by your health care provider. Follow the directions carefully. Blood pressure medicines must be taken as prescribed. The medicine does not work as well when you skip doses. Skipping doses also puts you at risk for problems.   Do not smoke.   Monitor your blood pressure at home as directed by your health care provider. SEEK MEDICAL CARE IF:   You think you are having a reaction to medicines taken.  You have recurrent headaches or feel dizzy.  You have swelling in your ankles.  You have  trouble with your vision. SEEK IMMEDIATE MEDICAL CARE IF:  You develop a severe headache or confusion.  You have unusual weakness, numbness, or feel faint.  You have severe chest or abdominal pain.  You vomit repeatedly.  You have trouble breathing. MAKE SURE YOU:   Understand these instructions.  Will watch your  condition.  Will get help right away if you are not doing well or get worse. Document Released: 09/20/2005 Document Revised: 02/04/2014 Document Reviewed: 07/13/2013 [facility] Patient Information 2015 [place], [place]. This information is not intended to replace advice given to you by your health care provider. Make sure you discuss any questions you have with your health care provider.     Insomnia Insomnia is frequent trouble falling and/or staying asleep. Insomnia can be a long term problem or a short term problem. Both are common. Insomnia can be a short term problem when the wakefulness is related to a certain stress or worry. Long term insomnia is often related to ongoing stress during waking hours and/or poor sleeping habits. Overtime, sleep deprivation itself can make the problem worse. Every little thing feels more severe because you are overtired and your ability to cope is decreased. CAUSES   Stress, anxiety, and depression.  Poor sleeping habits.  Distractions such as TV in the bedroom.  Naps close to bedtime.  Engaging in emotionally charged conversations before bed.  Technical reading before sleep.  Alcohol and other sedatives. They may make the problem worse. They can hurt normal sleep patterns and normal dream activity.  Stimulants such as caffeine for several hours prior to bedtime.  Pain syndromes and shortness of breath can cause insomnia.  Exercise late at night.  Changing time zones may cause sleeping problems (jet lag). It is sometimes helpful to have someone observe your sleeping patterns. They should look for periods of not breathing during the night (sleep apnea). They should also look to see how long those periods last. If you live alone or observers are uncertain, you can also be observed at a sleep clinic where your sleep patterns will be professionally monitored. Sleep apnea requires a checkup and treatment. Give your caregivers your medical history. Give  your caregivers observations your family has made about your sleep.  SYMPTOMS   Not feeling rested in the morning.  Anxiety and restlessness at bedtime.  Difficulty falling and staying asleep. TREATMENT   Your caregiver may prescribe treatment for an underlying medical disorders. Your caregiver can give advice or help if you are using alcohol or other drugs for self-medication. Treatment of underlying problems will usually eliminate insomnia problems.  Medications can be prescribed for short time use. They are generally not recommended for lengthy use.  Over-the-counter sleep medicines are not recommended for lengthy use. They can be habit forming.  You can promote easier sleeping by making lifestyle changes such as:  Using relaxation techniques that help with breathing and reduce muscle tension.  Exercising earlier in the day.  Changing your diet and the time of your last meal. No night time snacks.  Establish a regular time to go to bed.  Counseling can help with stressful problems and worry.  Soothing music and white noise may be helpful if there are background noises you cannot remove.  Stop tedious detailed work at least one hour before bedtime. HOME CARE INSTRUCTIONS   Keep a diary. Inform your caregiver about your progress. This includes any medication side effects. See your caregiver regularly. Take note of:  Times when you are asleep.  Times when you are awake during the night.  The quality of your sleep.  How you feel the next day. This information will help your caregiver care for you.  Get out of bed if you are still awake after 15 minutes. Read or do some quiet activity. Keep the lights down. Wait until you feel sleepy and go back to bed.  Keep regular sleeping and waking hours. Avoid naps.  Exercise regularly.  Avoid distractions at bedtime. Distractions include watching television or engaging in any intense or detailed activity like attempting to  balance the household checkbook.  Develop a bedtime ritual. Keep a familiar routine of bathing, brushing your teeth, climbing into bed at the same time each night, listening to soothing music. Routines increase the success of falling to sleep faster.  Use relaxation techniques. This can be using breathing and muscle tension release routines. It can also include visualizing peaceful scenes. You can also help control troubling or intruding thoughts by keeping your mind occupied with boring or repetitive thoughts like the old concept of counting sheep. You can make it more creative like imagining planting one beautiful flower after another in your backyard garden.  During your day, work to eliminate stress. When this is not possible use some of the previous suggestions to help reduce the anxiety that accompanies stressful situations. MAKE SURE YOU:   Understand these instructions.  Will watch your condition.  Will get help right away if you are not doing well or get worse. Document Released: 09/17/2000 Document Revised: 12/13/2011 Document Reviewed: 10/18/2007 [facility] Patient Information 2015 [place], [place]. This information is not intended to replace advice given to you by your health care provider. Make sure you discuss any questions you have with your health care provider.

## 2015-01-31 NOTE — Progress Notes (Signed)
Pre visit review using our clinic review tool, if applicable. No additional management support is needed unless otherwise documented below in the visit note. 

## 2015-02-09 ENCOUNTER — Encounter: Payer: Self-pay | Admitting: Family Medicine

## 2015-02-09 NOTE — Assessment & Plan Note (Signed)
Encouraged complete cessation. Discussed need to quit as relates to risk of numerous cancers, cardiac and pulmonary disease as well as neurologic complications. Counseled for greater than 3 minutes 

## 2015-02-09 NOTE — Assessment & Plan Note (Signed)
Well controlled, no changes to meds. Encouraged heart healthy diet such as the DASH diet and exercise as tolerated.  °

## 2015-02-09 NOTE — Assessment & Plan Note (Signed)
Avoid offending foods, start probiotics. Do not eat large meals in late evening and consider raising head of bed.  

## 2015-02-09 NOTE — Assessment & Plan Note (Signed)
Encouraged good sleep hygiene such as dark, quiet room. No blue/green glowing lights such as computer screens in bedroom. No alcohol or stimulants in evening. Cut down on caffeine as able. Regular exercise is helpful but not just prior to bed time.  

## 2015-02-09 NOTE — Assessment & Plan Note (Signed)
Encouraged heart healthy diet, increase exercise, avoid trans fats, consider a krill oil cap daily 
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
## 2015-02-09 NOTE — Assessment & Plan Note (Signed)
No recent flares 

## 2015-02-09 NOTE — Assessment & Plan Note (Signed)
Doing well, still does not feel she needs daily med but uses Alprazolam infrequently, may continue use, given refill

## 2015-04-01 DIAGNOSIS — H1852 Epithelial (juvenile) corneal dystrophy: Secondary | ICD-10-CM | POA: Diagnosis not present

## 2015-04-01 DIAGNOSIS — H25013 Cortical age-related cataract, bilateral: Secondary | ICD-10-CM | POA: Diagnosis not present

## 2015-04-01 DIAGNOSIS — H2513 Age-related nuclear cataract, bilateral: Secondary | ICD-10-CM | POA: Diagnosis not present

## 2015-04-01 DIAGNOSIS — H01003 Unspecified blepharitis right eye, unspecified eyelid: Secondary | ICD-10-CM | POA: Diagnosis not present

## 2015-04-03 ENCOUNTER — Encounter: Payer: Self-pay | Admitting: Family Medicine

## 2015-04-03 ENCOUNTER — Ambulatory Visit (INDEPENDENT_AMBULATORY_CARE_PROVIDER_SITE_OTHER): Payer: Medicare Other | Admitting: Family Medicine

## 2015-04-03 VITALS — BP 128/72 | HR 84 | Temp 98.4°F | Ht 64.0 in | Wt 137.1 lb

## 2015-04-03 DIAGNOSIS — Z720 Tobacco use: Secondary | ICD-10-CM | POA: Diagnosis not present

## 2015-04-03 DIAGNOSIS — I10 Essential (primary) hypertension: Secondary | ICD-10-CM

## 2015-04-03 DIAGNOSIS — E782 Mixed hyperlipidemia: Secondary | ICD-10-CM

## 2015-04-03 DIAGNOSIS — H6093 Unspecified otitis externa, bilateral: Secondary | ICD-10-CM | POA: Diagnosis not present

## 2015-04-03 DIAGNOSIS — K219 Gastro-esophageal reflux disease without esophagitis: Secondary | ICD-10-CM | POA: Diagnosis not present

## 2015-04-03 DIAGNOSIS — F17200 Nicotine dependence, unspecified, uncomplicated: Secondary | ICD-10-CM

## 2015-04-03 DIAGNOSIS — F17210 Nicotine dependence, cigarettes, uncomplicated: Secondary | ICD-10-CM | POA: Diagnosis not present

## 2015-04-03 MED ORDER — CIPROFLOXACIN-DEXAMETHASONE 0.3-0.1 % OT SUSP: 4.0000 [drp] | Freq: Two times a day (BID) | OTIC | Status: DC

## 2015-04-03 MED ORDER — RANITIDINE HCL 300 MG PO TABS: 300.0000 mg | ORAL_TABLET | Freq: Every day | ORAL | Status: DC

## 2015-04-03 NOTE — Assessment & Plan Note (Signed)
Well controlled, no changes to meds. Encouraged heart healthy diet such as the DASH diet and exercise as tolerated.  °

## 2015-04-03 NOTE — Patient Instructions (Signed)
Start a probiotic daily such as Digestive Advantage or Dixon for Gastroesophageal Reflux Disease When you have gastroesophageal reflux disease (GERD), the foods you eat and your eating habits are very important. Choosing the right foods can help ease your discomfort.  WHAT GUIDELINES DO I NEED TO FOLLOW?   Choose fruits, vegetables, whole grains, and low-fat dairy products.   Choose low-fat meat, fish, and poultry.  Limit fats such as oils, salad dressings, butter, nuts, and avocado.   Keep a food diary. This helps you identify foods that cause symptoms.   Avoid foods that cause symptoms. These may be different for everyone.   Eat small meals often instead of 3 large meals a day.   Eat your meals slowly, in a place where you are relaxed.   Limit fried foods.   Cook foods using methods other than frying.   Avoid drinking alcohol.   Avoid drinking large amounts of liquids with your meals.   Avoid bending over or lying down until 2-3 hours after eating.  WHAT FOODS ARE NOT RECOMMENDED?  These are some foods and drinks that may make your symptoms worse: Vegetables Tomatoes. Tomato juice. Tomato and spaghetti sauce. Chili peppers. Onion and garlic. Horseradish. Fruits Oranges, grapefruit, and lemon (fruit and juice). Meats High-fat meats, fish, and poultry. This includes hot dogs, ribs, ham, sausage, salami, and bacon. Dairy Whole milk and chocolate milk. Sour cream. Cream. Butter. Ice cream. Cream cheese.  Drinks Coffee and tea. Bubbly (carbonated) drinks or energy drinks. Condiments Hot sauce. Barbecue sauce.  Sweets/Desserts Chocolate and cocoa. Donuts. Peppermint and spearmint. Fats and Oils High-fat foods. This includes [place] fries and potato chips. Other Vinegar. Strong spices. This includes black pepper, white pepper, red pepper, cayenne, curry powder, cloves, ginger, and chili powder. The items listed above may not be a  complete list of foods and drinks to avoid. Contact your dietitian for more information. Document Released: 03/21/2012 Document Revised: 09/25/2013 Document Reviewed: 07/25/2013 [facility] Patient Information 2015 [place], [place]. This information is not intended to replace advice given to you by your health care provider. Make sure you discuss any questions you have with your health care provider.

## 2015-04-03 NOTE — Progress Notes (Signed)
Pre visit review using our clinic review tool, if applicable. No additional management support is needed unless otherwise documented below in the visit note. 
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
## 2015-04-04 ENCOUNTER — Telehealth: Payer: Self-pay | Admitting: Family Medicine

## 2015-04-04 NOTE — Telephone Encounter (Signed)
Caller name: Dhara Schepp Relationship to patient: self Can be reached: [phone] Pharmacy: [id number] - [place], [place] - [address] AT [place]  Reason for call: Pt said that [facility] won't cover ciprofloxacin-dexamethasone (CIPRODEX) otic suspension. It is going to be $251. She wants to know if we can change it to something else covered by her plan.

## 2015-04-05 NOTE — Telephone Encounter (Signed)
Please check with her pharmacy and see what other ear drops might be less expensive with her insurance.

## 2015-04-08 NOTE — Telephone Encounter (Signed)
ciprodex with no steroid would be ok.  (called [facility] and spoke to the pharmacist). She did want to know if he needed a steroid??

## 2015-04-08 NOTE — Telephone Encounter (Signed)
She will be Ok without the steroid rx Ciprodex 4 gtt in b/l ears bid x 7 days

## 2015-04-09 ENCOUNTER — Telehealth: Payer: Self-pay | Admitting: Family Medicine

## 2015-04-09 NOTE — Telephone Encounter (Signed)
error:[id number] ° °

## 2015-04-09 NOTE — Telephone Encounter (Signed)
Caller name: Kjersten Relation to pt: Call back number: [phone] Pharmacy:  Reason for call:   Patient states that Dr. Charlett Blake had order a cologuard? Screening for her and is wanting to make sure insurance covers this.

## 2015-04-09 NOTE — Telephone Encounter (Addendum)
Relation to pt: self  Call back number: [phone]   Reason for call:  Pt state ranitidine (ZANTAC) is not working pt experiencing constipation & sore throat. Pt states she was prescribed acid reflux medication that worked but pt does not know the name.  Pt states insurance will not cover ciprofloxacin-dexamethasone (CIPRODEX) otic suspension.

## 2015-04-10 ENCOUNTER — Telehealth: Payer: Self-pay | Admitting: Family Medicine

## 2015-04-10 MED ORDER — OMEPRAZOLE 20 MG PO CPDR: 20.0000 mg | DELAYED_RELEASE_CAPSULE | Freq: Every day | ORAL | Status: DC

## 2015-04-10 MED ORDER — CIPROFLOXACIN HCL 0.2 % OT SOLN: 0.2000 mL | Freq: Two times a day (BID) | OTIC | Status: DC

## 2015-04-10 NOTE — Telephone Encounter (Signed)
Refill done.  

## 2015-04-10 NOTE — Telephone Encounter (Signed)
She had omeprazole in past rx 20 mg tabs 1 tab po daily prn reflux, disp #30 with 2 rf. I think we reached out to pharmacy for the formulary for the ear drops and they said they would cover the ciprofloxacin without the steroid which I said I was happy to switch to. Please check with pharmacy again. I will cover whatever is on her formulary within reason

## 2015-04-10 NOTE — Telephone Encounter (Signed)
Called the patient this morning to inform to call [employer] at [phone] that her insurance information is on the application as well as we also faxed over a copy of her card along with the application on 01/22/02.  They should be able to give her the information she is requesting.

## 2015-04-10 NOTE — Addendum Note (Signed)
Addended by: Sharon Seller B on: 04/10/2015 01:58 PM   Modules accepted: Orders, Medications

## 2015-04-10 NOTE — Telephone Encounter (Signed)
Sent in ear drops and omeprazole as well. Patient informed.

## 2015-04-13 ENCOUNTER — Encounter: Payer: Self-pay | Admitting: Family Medicine

## 2015-04-13 DIAGNOSIS — C3490 Malignant neoplasm of unspecified part of unspecified bronchus or lung: Secondary | ICD-10-CM

## 2015-04-13 DIAGNOSIS — H6090 Unspecified otitis externa, unspecified ear: Secondary | ICD-10-CM | POA: Insufficient documentation

## 2015-04-13 HISTORY — DX: Malignant neoplasm of unspecified part of unspecified bronchus or lung: C34.90

## 2015-04-13 NOTE — Assessment & Plan Note (Signed)
Started on Ciprofloxacin drops

## 2015-04-13 NOTE — Assessment & Plan Note (Signed)
Avoid offending foods, take probiotics. Do not eat large meals in late evening and consider raising head of bed.  

## 2015-04-13 NOTE — Assessment & Plan Note (Signed)
Encouraged complete cessation. Discussed need to quit as relates to risk of numerous cancers, cardiac and pulmonary disease as well as neurologic complications. Counseled for greater than 3 minutes 

## 2015-04-13 NOTE — Assessment & Plan Note (Signed)
Encouraged heart healthy diet, increase exercise, avoid trans fats, consider a krill oil cap daily 

## 2015-04-21 DIAGNOSIS — Z1211 Encounter for screening for malignant neoplasm of colon: Secondary | ICD-10-CM | POA: Diagnosis not present

## 2015-04-21 DIAGNOSIS — Z1212 Encounter for screening for malignant neoplasm of rectum: Secondary | ICD-10-CM | POA: Diagnosis not present

## 2015-04-28 LAB — COLOGUARD

## 2015-05-14 ENCOUNTER — Telehealth: Payer: Self-pay | Admitting: Family Medicine

## 2015-05-14 NOTE — Telephone Encounter (Signed)
Left a message for call back.  No results noted in chart.

## 2015-05-14 NOTE — Telephone Encounter (Signed)
Pt calling to get results of cologard testing that was sent in a couple weeks ago. Please return her call.

## 2015-05-27 NOTE — Telephone Encounter (Signed)
Received the results from [employer], copied and mailed to the patient and put original in PCP's folder to sign.

## 2015-05-27 NOTE — Telephone Encounter (Signed)
Called [employer] at [phone] and did speak to a representative.  This patients result was negative and was resulted on 04/28/15.  There is nothing in her chart at this time where the results came her and scanned.  I did request that the result be faxed again to the fax number in the back of the office at [phone] so I would be able to receive the fax.  Once received will then copy and mail to the patients home.  I did call the patient to inform her of her results.  She did appreciate the call, but was not happy she had not heard.  I did inform her that [employer] only contacts the patient if the results are positive and did apologize she had not heard back at all,

## 2015-05-27 NOTE — Telephone Encounter (Signed)
Ph# [phone]  Pt is wanting to know about cologard results or call her if they have not been received. Please check with the company. She is very upset that it's been 5 weeks with no response.

## 2015-06-11 ENCOUNTER — Ambulatory Visit: Payer: Medicare Other | Admitting: Medical

## 2015-06-12 ENCOUNTER — Ambulatory Visit: Payer: Medicare Other | Admitting: Family Medicine

## 2015-06-13 ENCOUNTER — Encounter: Payer: Self-pay | Admitting: Family Medicine

## 2015-06-25 ENCOUNTER — Other Ambulatory Visit: Payer: Medicare Other

## 2015-06-30 ENCOUNTER — Ambulatory Visit: Payer: Medicare Other | Admitting: Endocrinology

## 2015-07-03 ENCOUNTER — Telehealth: Payer: Self-pay | Admitting: Family Medicine

## 2015-07-03 DIAGNOSIS — H60543 Acute eczematoid otitis externa, bilateral: Secondary | ICD-10-CM | POA: Diagnosis not present

## 2015-07-03 DIAGNOSIS — H6121 Impacted cerumen, right ear: Secondary | ICD-10-CM | POA: Diagnosis not present

## 2015-07-03 NOTE — Telephone Encounter (Signed)
First drop was Acetic acid, then we sent in 2 different cirpfloxacin drops one with hydrocortisone one without. They are all generic and have been around forever. There is no way for [place] to know that her insurance would not pay. These are not expensive brand name meds. She should be upset with her insurance not [place]. I am happy to send her records anywhere she would like them to go. And make note that we did inform her of the [employer] results

## 2015-07-03 NOTE — Telephone Encounter (Signed)
Pt called in stating she went to Dr. Laurance Flatten ENT in [facility]. 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 She was very upset that we called in 2 drops for her (6/30 and 7/7 that were not covered by insurance. She states there was another drop before those she was using that felt like it burned in her ears. She needs to know the name of it (said it was small bottle of drops). Please call back at [phone].  She also said that she never got results from [employer] and so there is no excuse that we haven't been able to send this to her. She said [employer] told her they sent it in 4 weeks before. She said that she has bleeding ulcers or bleeding hemorroids.   Pt is very dissatisfied with the service we've been providing and lack of follow up and wants to discuss concerns.

## 2015-07-03 NOTE — Telephone Encounter (Signed)
Advise on ear drops. Also regarding [employer] results.  Please read telephone note dated 05/14/15 regarding [employer]olog[employer].

## 2015-07-04 NOTE — Telephone Encounter (Signed)
LM for patient to return the call.  

## 2015-07-04 NOTE — Telephone Encounter (Signed)
Pt returning call. Please call back at [phone].

## 2015-07-07 NOTE — Telephone Encounter (Signed)
I spoke to this patient informed of PCP instructions.  She stated she had already called another physician and got ear drops that [facility] would cover.  Transferred the patient to a scheduler to scheduled appointment with PCP for nausea, burning and hemorrhoids.

## 2015-07-14 ENCOUNTER — Ambulatory Visit (HOSPITAL_BASED_OUTPATIENT_CLINIC_OR_DEPARTMENT_OTHER)
Admission: RE | Admit: 2015-07-14 | Discharge: 2015-07-14 | Disposition: A | Discharge status: Home / Self Care | Payer: Medicare Other | Source: Ambulatory Visit | Attending: Family Medicine | Admitting: Family Medicine

## 2015-07-14 ENCOUNTER — Ambulatory Visit (INDEPENDENT_AMBULATORY_CARE_PROVIDER_SITE_OTHER): Payer: Medicare Other | Admitting: Family Medicine

## 2015-07-14 ENCOUNTER — Encounter: Payer: Self-pay | Admitting: Family Medicine

## 2015-07-14 VITALS — BP 128/78 | HR 89 | Temp 98.5°F | Ht 63.5 in | Wt 135.5 lb

## 2015-07-14 DIAGNOSIS — I10 Essential (primary) hypertension: Secondary | ICD-10-CM

## 2015-07-14 DIAGNOSIS — E782 Mixed hyperlipidemia: Secondary | ICD-10-CM

## 2015-07-14 DIAGNOSIS — R05 Cough: Secondary | ICD-10-CM | POA: Insufficient documentation

## 2015-07-14 DIAGNOSIS — F17200 Nicotine dependence, unspecified, uncomplicated: Secondary | ICD-10-CM | POA: Diagnosis not present

## 2015-07-14 DIAGNOSIS — R1013 Epigastric pain: Secondary | ICD-10-CM

## 2015-07-14 DIAGNOSIS — J449 Chronic obstructive pulmonary disease, unspecified: Secondary | ICD-10-CM | POA: Insufficient documentation

## 2015-07-14 DIAGNOSIS — K625 Hemorrhage of anus and rectum: Secondary | ICD-10-CM | POA: Diagnosis not present

## 2015-07-14 DIAGNOSIS — R059 Cough, unspecified: Secondary | ICD-10-CM

## 2015-07-14 DIAGNOSIS — K219 Gastro-esophageal reflux disease without esophagitis: Secondary | ICD-10-CM

## 2015-07-14 DIAGNOSIS — R112 Nausea with vomiting, unspecified: Secondary | ICD-10-CM | POA: Diagnosis not present

## 2015-07-14 DIAGNOSIS — H6093 Unspecified otitis externa, bilateral: Secondary | ICD-10-CM

## 2015-07-14 LAB — COMPREHENSIVE METABOLIC PANEL
ALT: 14 U/L (ref 0–35)
AST: 18 U/L (ref 0–37)
Albumin: 4.3 g/dL (ref 3.5–5.2)
Alkaline Phosphatase: 81 U/L (ref 39–117)
BUN: 16 mg/dL (ref 6–23)
CO2: 32 mEq/L (ref 19–32)
CREATININE: 0.76 mg/dL (ref 0.40–1.20)
Calcium: 9.9 mg/dL (ref 8.4–10.5)
Chloride: 104 mEq/L (ref 96–112)
GFR: 80.52 mL/min (ref >60.00)
Glucose, Bld: 102 mg/dL — ABNORMAL HIGH (ref 70–99)
Potassium: 4.9 mEq/L (ref 3.5–5.1)
Sodium: 142 mEq/L (ref 135–145)
TOTAL PROTEIN: 7.2 g/dL (ref 6.0–8.3)
Total Bilirubin: 0.4 mg/dL (ref 0.2–1.2)

## 2015-07-14 LAB — LIPID PANEL
Cholesterol: 245 mg/dL — ABNORMAL HIGH (ref 0–200)
HDL: 51.5 mg/dL (ref >39.00)
LDL Cholesterol: 156 mg/dL — ABNORMAL HIGH (ref 0–99)
NonHDL: 193.87
Total CHOL/HDL Ratio: 5
Triglycerides: 191 mg/dL — ABNORMAL HIGH (ref 0.0–149.0)
VLDL: 38.2 mg/dL (ref 0.0–40.0)

## 2015-07-14 LAB — H. PYLORI ANTIBODY, IGG: H PYLORI IGG: NEGATIVE

## 2015-07-14 LAB — CBC
HCT: 44.6 % (ref 36.0–46.0)
Hemoglobin: 14.8 g/dL (ref 12.0–15.0)
MCHC: 33.2 g/dL (ref 30.0–36.0)
MCV: 89.6 fl (ref 78.0–100.0)
Platelets: 169 10*3/uL (ref 150.0–400.0)
RBC: 4.98 Mil/uL (ref 3.87–5.11)
RDW: 14.7 % (ref 11.5–15.5)
WBC: 11.5 10*3/uL — ABNORMAL HIGH (ref 4.0–10.5)

## 2015-07-14 LAB — SEDIMENTATION RATE: SED RATE: 20 mm/h (ref 0–22)

## 2015-07-14 NOTE — Progress Notes (Signed)
Pre visit review using our clinic review tool, if applicable. No additional management support is needed unless otherwise documented below in the visit note. 

## 2015-07-14 NOTE — Patient Instructions (Signed)
Food Choices for Gastroesophageal Reflux Disease, Adult  When you have gastroesophageal reflux disease (GERD), the foods you eat and your eating habits are very important. Choosing the right foods can help ease your discomfort.   WHAT GUIDELINES DO I NEED TO FOLLOW?   · Choose fruits, vegetables, whole grains, and low-fat dairy products.    · Choose low-fat meat, fish, and poultry.  · Limit fats such as oils, salad dressings, butter, nuts, and avocado.    · Keep a food diary. This helps you identify foods that cause symptoms.    · Avoid foods that cause symptoms. These may be different for everyone.    · Eat small meals often instead of 3 large meals a day.    · Eat your meals slowly, in a place where you are relaxed.    · Limit fried foods.    · Cook foods using methods other than frying.    · Avoid drinking alcohol.    · Avoid drinking large amounts of liquids with your meals.    · Avoid bending over or lying down until 2-3 hours after eating.    WHAT FOODS ARE NOT RECOMMENDED?   These are some foods and drinks that may make your symptoms worse:  Vegetables  Tomatoes. Tomato juice. Tomato and spaghetti sauce. Chili peppers. Onion and garlic. Horseradish.  Fruits  Oranges, grapefruit, and lemon (fruit and juice).  Meats  High-fat meats, fish, and poultry. This includes hot dogs, ribs, ham, sausage, salami, and bacon.  Dairy  Whole milk and chocolate milk. Sour cream. Cream. Butter. Ice cream. Cream cheese.   Drinks  Coffee and tea. Bubbly (carbonated) drinks or energy drinks.  Condiments  Hot sauce. Barbecue sauce.   Sweets/Desserts  Chocolate and cocoa. Donuts. Peppermint and spearmint.  Fats and Oils  High-fat foods. This includes French fries and potato chips.  Other  Vinegar. Strong spices. This includes black pepper, white pepper, red pepper, cayenne, curry powder, cloves, ginger, and chili powder.  The items listed above may not be a complete list of foods and drinks to avoid. Contact your dietitian for more  information.     This information is not intended to replace advice given to you by your health care provider. Make sure you discuss any questions you have with your health care provider.     Document Released: 03/21/2012 Document Revised: 10/11/2014 Document Reviewed: 07/25/2013  Elsevier Interactive Patient Education ©2016 [employer]

## 2015-07-15 ENCOUNTER — Other Ambulatory Visit: Payer: Self-pay | Admitting: Family Medicine

## 2015-07-15 ENCOUNTER — Telehealth: Payer: Self-pay | Admitting: Family Medicine

## 2015-07-15 DIAGNOSIS — F17200 Nicotine dependence, unspecified, uncomplicated: Secondary | ICD-10-CM

## 2015-07-15 DIAGNOSIS — R059 Cough, unspecified: Secondary | ICD-10-CM

## 2015-07-15 DIAGNOSIS — R05 Cough: Secondary | ICD-10-CM

## 2015-07-15 DIAGNOSIS — K219 Gastro-esophageal reflux disease without esophagitis: Secondary | ICD-10-CM

## 2015-07-15 DIAGNOSIS — Z87891 Personal history of nicotine dependence: Secondary | ICD-10-CM

## 2015-07-15 DIAGNOSIS — J849 Interstitial pulmonary disease, unspecified: Secondary | ICD-10-CM

## 2015-07-15 DIAGNOSIS — R1084 Generalized abdominal pain: Secondary | ICD-10-CM

## 2015-07-15 NOTE — Telephone Encounter (Signed)
Called the patient informed of lab results and PCP instructions.  The patient would like a referral for GI and low dose CT scan.

## 2015-07-17 ENCOUNTER — Encounter: Payer: Self-pay | Admitting: Gastroenterology

## 2015-07-20 ENCOUNTER — Encounter: Payer: Self-pay | Admitting: Family Medicine

## 2015-07-20 NOTE — Assessment & Plan Note (Signed)
We struggled getting her drops secondary to her insurance cover but she is feeling better now

## 2015-07-20 NOTE — Assessment & Plan Note (Signed)
Encouraged heart healthy diet, increase exercise, avoid trans fats, consider a krill oil cap daily. Patient hesitant to start statins.

## 2015-07-20 NOTE — Assessment & Plan Note (Signed)
Worsening symptoms despite diet changes. Still experiencing abdominal pain, intermittent rectal bleeding despite negative cologuard on August 24. Struggles with anorexia and nausea after eating. Awaking with nausea and increased epigastric pain. Referred to gastroenterology for further consideration and continue bland diet, probiotics and meds for now.

## 2015-07-20 NOTE — Assessment & Plan Note (Signed)
Well controlled, no changes to meds. Encouraged heart healthy diet such as the DASH diet and exercise as tolerated.  °
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
## 2015-07-20 NOTE — Assessment & Plan Note (Signed)
Poses significant risk to her health. Proceed with low CT scan and Encouraged complete cessation. Discussed need to quit as relates to risk of numerous cancers, cardiac and pulmonary disease as well as neurologic complications. Counseled for greater than 3 minutes

## 2015-07-22 ENCOUNTER — Ambulatory Visit (HOSPITAL_BASED_OUTPATIENT_CLINIC_OR_DEPARTMENT_OTHER)
Admission: RE | Admit: 2015-07-22 | Discharge: 2015-07-22 | Disposition: A | Discharge status: Home / Self Care | Payer: Medicare Other | Source: Ambulatory Visit | Attending: Family Medicine | Admitting: Family Medicine

## 2015-07-22 DIAGNOSIS — F17210 Nicotine dependence, cigarettes, uncomplicated: Secondary | ICD-10-CM | POA: Diagnosis not present

## 2015-07-22 DIAGNOSIS — F17200 Nicotine dependence, unspecified, uncomplicated: Secondary | ICD-10-CM | POA: Insufficient documentation

## 2015-07-22 DIAGNOSIS — I700 Atherosclerosis of aorta: Secondary | ICD-10-CM | POA: Diagnosis not present

## 2015-07-22 DIAGNOSIS — J849 Interstitial pulmonary disease, unspecified: Secondary | ICD-10-CM | POA: Diagnosis not present

## 2015-07-22 DIAGNOSIS — R1013 Epigastric pain: Secondary | ICD-10-CM | POA: Diagnosis not present

## 2015-07-22 DIAGNOSIS — R05 Cough: Secondary | ICD-10-CM

## 2015-07-22 DIAGNOSIS — J439 Emphysema, unspecified: Secondary | ICD-10-CM | POA: Insufficient documentation

## 2015-07-22 DIAGNOSIS — R911 Solitary pulmonary nodule: Secondary | ICD-10-CM | POA: Insufficient documentation

## 2015-07-22 DIAGNOSIS — K625 Hemorrhage of anus and rectum: Secondary | ICD-10-CM

## 2015-07-22 DIAGNOSIS — K769 Liver disease, unspecified: Secondary | ICD-10-CM | POA: Insufficient documentation

## 2015-07-22 DIAGNOSIS — Z87891 Personal history of nicotine dependence: Secondary | ICD-10-CM

## 2015-07-22 DIAGNOSIS — R059 Cough, unspecified: Secondary | ICD-10-CM

## 2015-07-22 DIAGNOSIS — R112 Nausea with vomiting, unspecified: Secondary | ICD-10-CM

## 2015-07-23 ENCOUNTER — Telehealth: Payer: Self-pay

## 2015-07-23 ENCOUNTER — Other Ambulatory Visit: Payer: Self-pay | Admitting: Family Medicine

## 2015-07-23 DIAGNOSIS — F411 Generalized anxiety disorder: Secondary | ICD-10-CM

## 2015-07-23 DIAGNOSIS — R911 Solitary pulmonary nodule: Secondary | ICD-10-CM

## 2015-07-23 MED ORDER — ALPRAZOLAM 0.25 MG PO TABS: 0.2500 mg | ORAL_TABLET | Freq: Two times a day (BID) | ORAL | Status: AC | PRN

## 2015-07-23 NOTE — Telephone Encounter (Signed)
I called the patient and advised the patient that Dr.Blyth would like to see her today in the office, the patient verbalized understanding and did not ask any questions, she will be here today at 9:45 to meet with Dr.Blyth at 10 am.  The check in staff is aware that the patient will be coming in today and to call me when she gets here.     KP

## 2015-07-30 ENCOUNTER — Other Ambulatory Visit: Payer: Self-pay | Admitting: *Deleted

## 2015-07-30 ENCOUNTER — Encounter: Payer: Self-pay | Admitting: Surgery

## 2015-07-30 ENCOUNTER — Institutional Professional Consult (permissible substitution) (INDEPENDENT_AMBULATORY_CARE_PROVIDER_SITE_OTHER): Payer: Medicare Other | Admitting: Surgery

## 2015-07-30 VITALS — BP 152/90 | HR 100 | Resp 16 | Ht 63.0 in | Wt 135.0 lb

## 2015-07-30 DIAGNOSIS — D381 Neoplasm of uncertain behavior of trachea, bronchus and lung: Secondary | ICD-10-CM

## 2015-07-30 DIAGNOSIS — R911 Solitary pulmonary nodule: Secondary | ICD-10-CM

## 2015-07-31 ENCOUNTER — Encounter: Payer: Self-pay | Admitting: Surgery
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
## 2015-08-04 ENCOUNTER — Encounter (HOSPITAL_COMMUNITY)
Admission: RE | Admit: 2015-08-04 | Discharge: 2015-08-04 | Disposition: A | Discharge status: Home / Self Care | Payer: Medicare Other | Source: Ambulatory Visit | Attending: Surgery | Admitting: Surgery

## 2015-08-04 ENCOUNTER — Ambulatory Visit (HOSPITAL_COMMUNITY)
Admission: RE | Admit: 2015-08-04 | Discharge: 2015-08-04 | Disposition: A | Discharge status: Home / Self Care | Payer: Medicare Other | Source: Ambulatory Visit | Attending: Surgery | Admitting: Surgery

## 2015-08-04 DIAGNOSIS — E042 Nontoxic multinodular goiter: Secondary | ICD-10-CM | POA: Diagnosis not present

## 2015-08-04 DIAGNOSIS — R911 Solitary pulmonary nodule: Secondary | ICD-10-CM | POA: Insufficient documentation

## 2015-08-04 DIAGNOSIS — J439 Emphysema, unspecified: Secondary | ICD-10-CM | POA: Diagnosis not present

## 2015-08-04 DIAGNOSIS — Z79899 Other long term (current) drug therapy: Secondary | ICD-10-CM | POA: Diagnosis not present

## 2015-08-04 LAB — PULMONARY FUNCTION TEST
DL/VA % pred: 54 %
DL/VA: 2.55 ml/min/mmHg/L
DLCO UNC % PRED: 58 %
DLCO UNC: 13.4 ml/min/mmHg
DLCO cor % pred: 56 %
DLCO cor: 12.88 ml/min/mmHg
FEF 25-75 Post: 1.53 L/sec
FEF 25-75 Pre: 2.08 L/sec
FEF2575-%Change-Post: -26 %
FEF2575-%Pred-Post: 78 %
FEF2575-%Pred-Pre: 106 %
FEV1-%CHANGE-POST: -6 %
FEV1-%PRED-POST: 105 %
FEV1-%Pred-Pre: 112 %
FEV1-POST: 2.36 L
FEV1-Pre: 2.53 L
FEV1FVC-%CHANGE-POST: -5 %
FEV1FVC-%Pred-Pre: 99 %
FEV6-%CHANGE-POST: -2 %
FEV6-%PRED-POST: 114 %
FEV6-%Pred-Pre: 116 %
FEV6-Post: 3.24 L
FEV6-Pre: 3.31 L
FEV6FVC-%CHANGE-POST: -1 %
FEV6FVC-%Pred-Post: 101 %
FEV6FVC-%Pred-Pre: 102 %
FVC-%Change-Post: 0 %
FVC-%Pred-Post: 112 %
FVC-%Pred-Pre: 113 %
FVC-Post: 3.33 L
FVC-Pre: 3.35 L
POST FEV1/FVC RATIO: 71 %
PRE FEV6/FVC RATIO: 99 %
Post FEV6/FVC ratio: 97 %
Pre FEV1/FVC ratio: 75 %
RV % pred: 107 %
RV: 2.25 L
TLC % pred: 117 %
TLC: 5.78 L

## 2015-08-04 LAB — GLUCOSE, CAPILLARY: Glucose-Capillary: 102 mg/dL — ABNORMAL HIGH (ref 65–99)

## 2015-08-04 MED ORDER — FLUDEOXYGLUCOSE F - 18 (FDG) INJECTION
7.1000 | Freq: Once | INTRAVENOUS | Status: DC | PRN
  Administered 2015-08-04: 7.1 via INTRAVENOUS
  Filled 2015-08-04: qty 7.1

## 2015-08-04 MED ORDER — ALBUTEROL SULFATE (2.5 MG/3ML) 0.083% IN NEBU
2.5000 mg | INHALATION_SOLUTION | Freq: Once | RESPIRATORY_TRACT | Status: AC
  Administered 2015-08-04: 2.5 mg via RESPIRATORY_TRACT

## 2015-08-05 ENCOUNTER — Encounter: Payer: Self-pay | Admitting: Surgery

## 2015-08-05 ENCOUNTER — Ambulatory Visit (INDEPENDENT_AMBULATORY_CARE_PROVIDER_SITE_OTHER): Payer: Medicare Other | Admitting: Surgery

## 2015-08-05 VITALS — BP 182/94 | HR 87 | Resp 16 | Ht 63.0 in | Wt 135.0 lb

## 2015-08-05 DIAGNOSIS — D381 Neoplasm of uncertain behavior of trachea, bronchus and lung: Secondary | ICD-10-CM

## 2015-08-06 ENCOUNTER — Encounter: Payer: Self-pay | Admitting: Gastroenterology

## 2015-08-06 ENCOUNTER — Institutional Professional Consult (permissible substitution): Payer: Medicare Other | Admitting: Pulmonary Disease

## 2015-08-06 ENCOUNTER — Ambulatory Visit (INDEPENDENT_AMBULATORY_CARE_PROVIDER_SITE_OTHER): Payer: Medicare Other | Admitting: Gastroenterology

## 2015-08-06 ENCOUNTER — Encounter: Payer: Self-pay | Admitting: Surgery

## 2015-08-06 VITALS — BP 142/70 | HR 96 | Ht 63.0 in | Wt 134.2 lb

## 2015-08-06 DIAGNOSIS — R1032 Left lower quadrant pain: Secondary | ICD-10-CM

## 2015-08-06 DIAGNOSIS — R110 Nausea: Secondary | ICD-10-CM

## 2015-08-06 MED ORDER — PROMETHAZINE HCL 25 MG PO TABS: 25.0000 mg | ORAL_TABLET | Freq: Four times a day (QID) | ORAL | Status: AC | PRN

## 2015-08-06 NOTE — Patient Instructions (Addendum)
You have been given a separate informational sheet regarding your tobacco use, the importance of quitting and local resources to help you quit.  You have been scheduled for a CT scan of the abdomen and pelvis at [facility] ([address]---this is in the same building as Press [facility]).   You are scheduled on 08-08-2015 at 3pm. You should arrive 15 minutes prior to your appointment time for registration. Please follow the written instructions below on the day of your exam:  WARNING: IF YOU ARE ALLERGIC TO IODINE/X-RAY DYE, PLEASE NOTIFY RADIOLOGY IMMEDIATELY AT [phone]! YOU WILL BE GIVEN A 13 HOUR PREMEDICATION PREP.  1) Do not eat or drink anything after 11am (4 hours prior to your test) 2) You have been given 2 bottles of oral contrast to drink. The solution may taste better if refrigerated, but do NOT add ice or any other liquid to this solution. Shake well before drinking.    Drink 1 bottle of contrast @ 1pm (2 hours prior to your exam)  Drink 1 bottle of contrast @ 2pm (1 hour prior to your exam)  You may take any medications as prescribed with a small amount of water except for the following: Metformin, Glucophage, Glucovance, Avandamet, Riomet, Fortamet, Actoplus Met, Janumet, Glumetza or Metaglip. The above medications must be held the day of the exam AND 48 hours after the exam.  The purpose of you drinking the oral contrast is to aid in the visualization of your intestinal tract. The contrast solution may cause some diarrhea. Before your exam is started, you will be given a small amount of fluid to drink. Depending on your individual set of symptoms, you may also receive an intravenous injection of x-ray contrast/dye. Plan on being at [facility] for 30 minutes or long, depending on the type of exam you are having performed.  This test typically takes 30-45 minutes to complete.  If you have any questions regarding your exam or if you need to reschedule,  you may call the CT department at [phone] between the hours of 8:00 am and 5:00 pm, Monday-Friday.  ________________________________________________________________________  Julie Henry have been scheduled for an endoscopy. Please follow written instructions given to you at your visit today. If you use inhalers (even only as needed), please bring them with you on the day of your procedure. Your physician has requested that you go to www.startemmi.com and enter the access code given to you at your visit today. This web site gives a general overview about your procedure. However, you should still follow specific instructions given to you by our office regarding your preparation for the procedure.  We have sent the following medications to your pharmacy for you to pick up at your convenience: Phenergan

## 2015-08-06 NOTE — Progress Notes (Signed)
HPI:  She returns today to discuss the results of her recent PET scan and PFT's. She continues to complain of persistent nausea and intermittent left sided abdominal pain with a feeling like there is something bulging outward on the left side of the abdomen that comes and goes. She has an appt with GI in December to evaluate this.  Her PET scan was reviewed with her and her daughter. The 2 cm RUL apical nodule is hypermetabolic with an SUV max of 7. This is highly suspicious for lung cancer. There was no hypermetabolic lymphadenopathy and no distant hypermetabolism. There is also a hypermetabolic nodule in the right lobe of the thyroid. She has a history of multinodular thyroid goiter and has had surgery in the distant past elsewhere. She thinks she had a cancer removed at that time but does not remember the details. She thinks it may have been done when she lived in [place]. She has been followed by Dr. Dwyane Dee but has not seen him in a while. She had a thyroid ultrasound in May 2015 that showed bilateral nodules with a dominant right thyroid nodule. A thyroid nuclear uptake scan in 05/2014 showed an enlarged nodular thyroid consistent with multinodular goiter.  PFT's show mild obstruction and a moderate diffusion defect.  Current Outpatient Prescriptions  Medication Sig Dispense Refill  . ALPRAZolam (XANAX) 0.25 MG tablet Take 1 tablet (0.25 mg total) by mouth 2 (two) times daily as needed for anxiety. 40 tablet 1  . amLODipine (NORVASC) 5 MG tablet Take 1 tablet (5 mg total) by mouth daily. 90 tablet 3  . Cholecalciferol (VITAMIN D) 2000 UNITS tablet Take 2,000 Units by mouth daily.    . Multiple Vitamin (MULTIVITAMIN) tablet Take 1 tablet by mouth daily.    Marland Kitchen neomycin-polymyxin-hydrocortisone (CORTISPORIN) 3.5-10000-1 otic suspension Place 3.5-1,000 drops into both ears daily.    . Omega-3 Fatty Acids (FISH OIL PO) Take by mouth daily.    Marland Kitchen omeprazole (PRILOSEC) 20 MG capsule Take 1 capsule  (20 mg total) by mouth daily. (Patient not taking: Reported on 07/30/2015) 30 capsule 2  . ranitidine (ZANTAC) 300 MG tablet Take 1 tablet (300 mg total) by mouth at bedtime. (Patient not taking: Reported on 07/30/2015) 30 tablet 2   No current facility-administered medications for this visit.   Facility-Administered Medications Ordered in Other Visits  Medication Dose Route Frequency Provider Last Rate Last Dose  . fludeoxyglucose F - 18 (FDG) injection 7.1 milli Curie  7.1 milli Curie Intravenous Once PRN Gaye Pollack, MD   7.1 milli Curie at 08/04/15 1300     Physical Exam: BP 182/94 mmHg  Pulse 87  Resp 16  Ht '5\' 3"'$  (1.6 m)  Wt 135 lb (61.236 kg)  BMI 23.92 kg/m2  SpO2 96% She looks well  Lungs are clear  Diagnostic Tests:  CLINICAL DATA: Initial treatment strategy for right upper lobe pulmonary nodule.  EXAM: NUCLEAR MEDICINE PET SKULL BASE TO THIGH  TECHNIQUE: 7.1 mCi F-18 FDG was injected intravenously. Full-ring PET imaging was performed from the skull base to thigh after the radiotracer. CT data was obtained and used for attenuation correction and anatomic localization.  FASTING BLOOD GLUCOSE: Value: 102 mg/dl  COMPARISON: CT on 07/22/2015  FINDINGS: NECK  No definite hypermetabolic lymph nodes in the neck. Hypermetabolic nodule in the lower right neck appears to localize to the lateral right thyroid lobe. This has an SUV max of 7.2. The thyroid gland is diffusely enlarged and shows multiple  nodules consistent with diffuse multinodular goiter.  CHEST  No hypermetabolic mediastinal or hilar nodes.  A 2 cm spiculated nodule in the right lung apex hypermetabolic, with SUV max of 7.7. This is highly suspicious for primary bronchogenic carcinoma. No other suspicious pulmonary nodules seen on CT. Mild emphysema noted.  ABDOMEN/PELVIS  No abnormal hypermetabolic activity within the liver, pancreas, adrenal glands, or spleen. Several fluid  attenuation cysts are seen in the liver which show no metabolic activity. A small fat attenuation mass is seen involving the right adrenal gland which shows no metabolic activity, consistent with a small benign myelolipoma.  No hypermetabolic lymph nodes in the abdomen or pelvis.  SKELETON  No focal hypermetabolic activity to suggest skeletal metastasis.  IMPRESSION: 2 cm spiculated hypermetabolic nodule in the right lung apex, consistent with primary bronchogenic carcinoma.  No evidence of thoracic nodal or distant metastatic disease.  Multinodular goiter with small hypermetabolic nodule localizing to the lateral right thyroid lobe. Thyroid carcinoma cannot be excluded. Recommend ultrasound to further evaluate the thyroid gland and to assess for adjacent lymphadenopathy.   Electronically Signed  By: Earle Gell M.D.  On: 08/04/2015 15:10    Ref Range 2d ago    FVC-Pre L 3.35P   FVC-%Pred-Pre % 113P   FVC-Post L 3.33P   FVC-%Pred-Post % 112P   FVC-%Change-Post % 0P   FEV1-Pre L 2.53P   FEV1-%Pred-Pre % 112P   FEV1-Post L 2.36P   FEV1-%Pred-Post % 105P   FEV1-%Change-Post % -6P   FEV6-Pre L 3.31P   FEV6-%Pred-Pre % 116P   FEV6-Post L 3.24P   FEV6-%Pred-Post % 114P   FEV6-%Change-Post % -2P   Pre FEV1/FVC ratio % 75P   FEV1FVC-%Pred-Pre % 99P   Post FEV1/FVC ratio % 71P   FEV1FVC-%Change-Post % -5P   Pre FEV6/FVC Ratio % 99P   FEV6FVC-%Pred-Pre % 102P   Post FEV6/FVC ratio % 97P   FEV6FVC-%Pred-Post % 101P   FEV6FVC-%Change-Post % -1P   FEF 25-75 Pre L/sec 2.08P   FEF2575-%Pred-Pre % 106P   FEF 25-75 Post L/sec 1.53P   FEF2575-%Pred-Post % 78P   FEF2575-%Change-Post % -26P   RV L 2.25P   RV % pred % 107P   TLC L 5.78P   TLC % pred % 117P   DLCO unc ml/min/mmHg 13.40P   DLCO unc % pred % 58P   DLCO cor ml/min/mmHg 12.88P   DLCO cor % pred % 56P   DL/VA ml/min/mmHg/L 2.55P   DL/VA % pred % 54P     Resulting Agency [facility]       Specimen Collected: 08/04/15 10:02 AM Last Resulted: 08/04/15 10:59 AM             P=Value has a preliminary status      Scans on Order [id number]        Scan on 08/04/2015 10:59 AM by Tanda Rockers, MDScan on 08/04/2015 10:59 AM by Tanda Rockers, MD            Impression:  She has a 2 cm hypermetabolic RUL apical lung nodule that is highly suspicious for lung cancer with a long smoking history although she says that she only smokes a few cigarettes per day. She also has a hypermetabolic nodule in the right lobe of the thyroid with a long history of multinodular goiter. Her PFT's are adequate to allow a right upper lobectomy. I think it is probably best to proceed with thoracotomy for RUL wedge resection and completion lobectomy if the frozen section  confirms cancer. I discussed the option of obtaining a CT-guided needle biopsy first but if that was indeterminate or negative I would still be very concerned about this lesion. Therefore I have recommended against that and would plan to do a wedge resection first. She is a smoker with coronary calcifications on CT scan, extensive aortic atherosclerosis and a family history of heart disease so I think a preop cardiology evaluation is indicated. She does not complain of any chest pain per se but certainly is at risk. She would like to see someone else concerning her hypermetabolic thyroid nodule so I will ask Dr. Harlow Asa to see her for consultation. Lastly is her constellation of abdominal complaints including persistent nausea, abdominal pain, blood per rectum. She is scheduled to see Dr. Fuller Plan in December and we will try to get her in sooner since these symptoms are really her major complaint and will likely have some bearing on her recovery after lung surgery.   Plan:  1. Cardiology evaluation for preop clearance.  2. Consultation with Dr. Harlow Asa concerning her thyroid nodule.  3. Try to move up her  GI consultation.  4. I will see her back in the office after her cardiology evaluation to make plans for surgery depending on the recommendations.   Gaye Pollack, MD [facility] [phone]

## 2015-08-08 ENCOUNTER — Ambulatory Visit (INDEPENDENT_AMBULATORY_CARE_PROVIDER_SITE_OTHER)
Admission: RE | Admit: 2015-08-08 | Discharge: 2015-08-08 | Disposition: A | Discharge status: Home / Self Care | Payer: Medicare Other | Source: Ambulatory Visit | Attending: Gastroenterology | Admitting: Gastroenterology

## 2015-08-08 DIAGNOSIS — R1032 Left lower quadrant pain: Secondary | ICD-10-CM | POA: Diagnosis not present

## 2015-08-08 DIAGNOSIS — R112 Nausea with vomiting, unspecified: Secondary | ICD-10-CM | POA: Diagnosis not present

## 2015-08-08 MED ORDER — IOHEXOL 300 MG/ML  SOLN
100.0000 mL | Freq: Once | INTRAMUSCULAR | Status: AC | PRN
  Administered 2015-08-08: 100 mL via INTRAVENOUS

## 2015-08-11 ENCOUNTER — Telehealth: Payer: Self-pay | Admitting: Gastroenterology

## 2015-08-11 NOTE — Telephone Encounter (Signed)
Patient advised that if her BP is too high at the procedure they could elect to  not do the procedure.  All questions answered.  She will call back for any additional questions or concerns.

## 2015-08-12 DIAGNOSIS — R911 Solitary pulmonary nodule: Secondary | ICD-10-CM | POA: Diagnosis not present

## 2015-08-13 ENCOUNTER — Encounter: Payer: Self-pay | Admitting: *Deleted

## 2015-08-13 ENCOUNTER — Telehealth: Payer: Self-pay | Admitting: Gastroenterology

## 2015-08-13 ENCOUNTER — Encounter: Payer: Self-pay | Admitting: Gastroenterology

## 2015-08-13 DIAGNOSIS — R110 Nausea: Secondary | ICD-10-CM | POA: Insufficient documentation

## 2015-08-13 DIAGNOSIS — R1032 Left lower quadrant pain: Secondary | ICD-10-CM | POA: Insufficient documentation
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
## 2015-08-13 NOTE — Telephone Encounter (Signed)
Patient is requesting CT results Please, advise.

## 2015-08-14 NOTE — Progress Notes (Signed)
She was referred to you specifically by referring MD and was scheduled to see you in December but they had appt moved up.  Thanks,  [employer]
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
## 2015-08-18 ENCOUNTER — Ambulatory Visit (INDEPENDENT_AMBULATORY_CARE_PROVIDER_SITE_OTHER): Payer: Medicare Other | Admitting: Cardiovascular Disease

## 2015-08-18 ENCOUNTER — Encounter: Payer: Medicare Other | Admitting: Gastroenterology

## 2015-08-18 ENCOUNTER — Encounter: Payer: Self-pay | Admitting: Cardiovascular Disease

## 2015-08-18 VITALS — BP 140/74 | HR 81 | Ht 63.5 in | Wt 135.4 lb

## 2015-08-18 DIAGNOSIS — Z01810 Encounter for preprocedural cardiovascular examination: Secondary | ICD-10-CM

## 2015-08-18 NOTE — Patient Instructions (Signed)
Medication Instructions:  Your physician recommends that you continue on your current medications as directed. Please refer to the Current Medication list given to you today.  Labwork: NONE  Testing/Procedures: Your physician has requested that you have an exercise tolerance test. For further information please visit [email or web address]. Please also follow instruction sheet, as given.   Follow-Up: AS NEEDED  Any Other Special Instructions Will Be Listed Below (If Applicable).     If you need a refill on your cardiac medications before your next appointment, please call your pharmacy.

## 2015-08-19 ENCOUNTER — Telehealth (HOSPITAL_COMMUNITY): Payer: Self-pay

## 2015-08-19 NOTE — Telephone Encounter (Signed)
Encounter complete. 

## 2015-08-20 ENCOUNTER — Encounter: Payer: Medicare Other | Admitting: Surgery

## 2015-08-20 ENCOUNTER — Ambulatory Visit (HOSPITAL_COMMUNITY)
Admission: RE | Admit: 2015-08-20 | Discharge: 2015-08-20 | Disposition: A | Discharge status: Home / Self Care | Payer: Medicare Other | Source: Ambulatory Visit | Attending: Cardiovascular Disease | Admitting: Cardiovascular Disease

## 2015-08-20 DIAGNOSIS — Z01810 Encounter for preprocedural cardiovascular examination: Secondary | ICD-10-CM | POA: Diagnosis not present

## 2015-08-20 LAB — EXERCISE TOLERANCE TEST
CHL CUP MPHR: 153 {beats}/min
CHL CUP RESTING HR STRESS: 78 {beats}/min
CHL RATE OF PERCEIVED EXERTION: 15
CSEPHR: 100 %
Estimated workload: 11.7 METS
Exercise duration (min): 10 min
Peak HR: 153 {beats}/min

## 2015-08-22 DIAGNOSIS — R911 Solitary pulmonary nodule: Secondary | ICD-10-CM | POA: Diagnosis not present

## 2015-08-22 DIAGNOSIS — K219 Gastro-esophageal reflux disease without esophagitis: Secondary | ICD-10-CM | POA: Diagnosis not present

## 2015-08-22 DIAGNOSIS — E042 Nontoxic multinodular goiter: Secondary | ICD-10-CM | POA: Diagnosis not present

## 2015-08-22 DIAGNOSIS — R1032 Left lower quadrant pain: Secondary | ICD-10-CM | POA: Diagnosis not present

## 2015-08-26 DIAGNOSIS — K7689 Other specified diseases of liver: Secondary | ICD-10-CM | POA: Diagnosis not present

## 2015-08-26 DIAGNOSIS — R05 Cough: Secondary | ICD-10-CM | POA: Diagnosis not present

## 2015-08-26 DIAGNOSIS — J449 Chronic obstructive pulmonary disease, unspecified: Secondary | ICD-10-CM | POA: Diagnosis not present

## 2015-08-26 DIAGNOSIS — Z79899 Other long term (current) drug therapy: Secondary | ICD-10-CM | POA: Diagnosis not present

## 2015-08-26 DIAGNOSIS — F17210 Nicotine dependence, cigarettes, uncomplicated: Secondary | ICD-10-CM | POA: Diagnosis not present

## 2015-08-26 DIAGNOSIS — E042 Nontoxic multinodular goiter: Secondary | ICD-10-CM | POA: Diagnosis not present

## 2015-08-26 DIAGNOSIS — C73 Malignant neoplasm of thyroid gland: Secondary | ICD-10-CM | POA: Diagnosis not present

## 2015-08-26 DIAGNOSIS — C3490 Malignant neoplasm of unspecified part of unspecified bronchus or lung: Secondary | ICD-10-CM | POA: Diagnosis not present

## 2015-08-26 DIAGNOSIS — R911 Solitary pulmonary nodule: Secondary | ICD-10-CM | POA: Diagnosis not present

## 2015-08-26 DIAGNOSIS — I10 Essential (primary) hypertension: Secondary | ICD-10-CM | POA: Diagnosis not present

## 2015-08-26 DIAGNOSIS — R102 Pelvic and perineal pain: Secondary | ICD-10-CM | POA: Diagnosis not present

## 2015-08-26 DIAGNOSIS — R1032 Left lower quadrant pain: Secondary | ICD-10-CM | POA: Diagnosis not present

## 2015-08-26 DIAGNOSIS — K219 Gastro-esophageal reflux disease without esophagitis: Secondary | ICD-10-CM | POA: Diagnosis not present

## 2015-09-03 DIAGNOSIS — E049 Nontoxic goiter, unspecified: Secondary | ICD-10-CM | POA: Diagnosis not present

## 2015-09-04 DIAGNOSIS — D127 Benign neoplasm of rectosigmoid junction: Secondary | ICD-10-CM | POA: Diagnosis not present

## 2015-09-04 DIAGNOSIS — R079 Chest pain, unspecified: Secondary | ICD-10-CM | POA: Diagnosis not present

## 2015-09-04 DIAGNOSIS — R110 Nausea: Secondary | ICD-10-CM | POA: Diagnosis not present

## 2015-09-04 DIAGNOSIS — Z79899 Other long term (current) drug therapy: Secondary | ICD-10-CM | POA: Diagnosis not present

## 2015-09-04 DIAGNOSIS — F419 Anxiety disorder, unspecified: Secondary | ICD-10-CM | POA: Diagnosis not present

## 2015-09-04 DIAGNOSIS — K648 Other hemorrhoids: Secondary | ICD-10-CM | POA: Diagnosis not present

## 2015-09-04 DIAGNOSIS — E079 Disorder of thyroid, unspecified: Secondary | ICD-10-CM | POA: Diagnosis not present

## 2015-09-04 DIAGNOSIS — Z1211 Encounter for screening for malignant neoplasm of colon: Secondary | ICD-10-CM | POA: Diagnosis not present

## 2015-09-04 DIAGNOSIS — I10 Essential (primary) hypertension: Secondary | ICD-10-CM | POA: Diagnosis not present

## 2015-09-04 DIAGNOSIS — C3411 Malignant neoplasm of upper lobe, right bronchus or lung: Secondary | ICD-10-CM | POA: Diagnosis not present

## 2015-09-04 DIAGNOSIS — F329 Major depressive disorder, single episode, unspecified: Secondary | ICD-10-CM | POA: Diagnosis not present

## 2015-09-04 DIAGNOSIS — R1032 Left lower quadrant pain: Secondary | ICD-10-CM | POA: Diagnosis not present

## 2015-09-04 DIAGNOSIS — E7800 Pure hypercholesterolemia, unspecified: Secondary | ICD-10-CM | POA: Diagnosis not present

## 2015-09-04 DIAGNOSIS — K449 Diaphragmatic hernia without obstruction or gangrene: Secondary | ICD-10-CM | POA: Diagnosis not present

## 2015-09-04 DIAGNOSIS — F17200 Nicotine dependence, unspecified, uncomplicated: Secondary | ICD-10-CM | POA: Diagnosis not present

## 2015-09-04 DIAGNOSIS — K219 Gastro-esophageal reflux disease without esophagitis: Secondary | ICD-10-CM | POA: Diagnosis not present

## 2015-09-04 DIAGNOSIS — E785 Hyperlipidemia, unspecified: Secondary | ICD-10-CM | POA: Diagnosis not present

## 2015-09-04 DIAGNOSIS — C73 Malignant neoplasm of thyroid gland: Secondary | ICD-10-CM | POA: Diagnosis not present

## 2015-09-04 DIAGNOSIS — K621 Rectal polyp: Secondary | ICD-10-CM | POA: Diagnosis not present

## 2015-09-04 DIAGNOSIS — J449 Chronic obstructive pulmonary disease, unspecified: Secondary | ICD-10-CM | POA: Diagnosis not present

## 2015-09-09 ENCOUNTER — Other Ambulatory Visit: Payer: Self-pay | Admitting: Family Medicine

## 2015-09-09 ENCOUNTER — Ambulatory Visit (HOSPITAL_BASED_OUTPATIENT_CLINIC_OR_DEPARTMENT_OTHER): Payer: Medicare Other

## 2015-09-09 ENCOUNTER — Telehealth: Payer: Self-pay | Admitting: Family Medicine

## 2015-09-09 DIAGNOSIS — Z0000 Encounter for general adult medical examination without abnormal findings: Secondary | ICD-10-CM

## 2015-09-09 DIAGNOSIS — Z1239 Encounter for other screening for malignant neoplasm of breast: Secondary | ICD-10-CM

## 2015-09-09 DIAGNOSIS — Z1231 Encounter for screening mammogram for malignant neoplasm of breast: Secondary | ICD-10-CM

## 2015-09-09 NOTE — Telephone Encounter (Signed)
Called the patient informed of PCP instructions  She will wait until April as she has no money to pay.

## 2015-09-09 NOTE — Telephone Encounter (Signed)
Relation to GL:OVFI Call back number:[phone]   Reason for call:  Patient requesting mamo order for [facility] in [facility]

## 2015-09-09 NOTE — Telephone Encounter (Signed)
I tried to order [facility] for breast cancer screen and preventative health measures. The computer would not let me without making her signa a waiver, I think because she had a MGM last year and medicare typically covers every 2 years. She can call and check with them. If she wants to wait 2 years is up in April. The other option is for her to sign the waiver stating she understands she may not have it covered and we proceed anyway

## 2015-09-11 DIAGNOSIS — E049 Nontoxic goiter, unspecified: Secondary | ICD-10-CM | POA: Diagnosis not present

## 2015-09-11 DIAGNOSIS — E041 Nontoxic single thyroid nodule: Secondary | ICD-10-CM | POA: Diagnosis not present

## 2015-09-15 ENCOUNTER — Ambulatory Visit: Payer: Medicare Other | Admitting: Gastroenterology

## 2015-09-16 ENCOUNTER — Encounter: Payer: Self-pay | Admitting: Family Medicine

## 2015-09-16 ENCOUNTER — Ambulatory Visit (INDEPENDENT_AMBULATORY_CARE_PROVIDER_SITE_OTHER): Payer: Medicare Other | Admitting: Family Medicine

## 2015-09-16 VITALS — BP 156/77 | HR 93 | Temp 98.1°F | Ht 64.0 in | Wt 132.5 lb

## 2015-09-16 DIAGNOSIS — I10 Essential (primary) hypertension: Secondary | ICD-10-CM

## 2015-09-16 NOTE — Progress Notes (Signed)
Pre visit review using our clinic review tool, if applicable. No additional management support is needed unless otherwise documented below in the visit note. 

## 2015-09-19 DIAGNOSIS — K639 Disease of intestine, unspecified: Secondary | ICD-10-CM | POA: Diagnosis not present

## 2015-09-19 DIAGNOSIS — R938 Abnormal findings on diagnostic imaging of other specified body structures: Secondary | ICD-10-CM | POA: Diagnosis not present

## 2015-09-21 LAB — HM COLONOSCOPY

## 2015-09-21 NOTE — Progress Notes (Signed)
Patient was late for her appointment and then had to leave before being seen.

## 2015-09-22 DIAGNOSIS — E041 Nontoxic single thyroid nodule: Secondary | ICD-10-CM | POA: Diagnosis not present

## 2015-09-22 DIAGNOSIS — E049 Nontoxic goiter, unspecified: Secondary | ICD-10-CM | POA: Diagnosis not present

## 2015-09-22 DIAGNOSIS — R110 Nausea: Secondary | ICD-10-CM | POA: Diagnosis not present

## 2015-09-26 DIAGNOSIS — F17200 Nicotine dependence, unspecified, uncomplicated: Secondary | ICD-10-CM | POA: Diagnosis not present

## 2015-09-26 DIAGNOSIS — R079 Chest pain, unspecified: Secondary | ICD-10-CM | POA: Diagnosis not present

## 2015-09-26 DIAGNOSIS — I10 Essential (primary) hypertension: Secondary | ICD-10-CM | POA: Diagnosis not present

## 2015-09-26 DIAGNOSIS — J449 Chronic obstructive pulmonary disease, unspecified: Secondary | ICD-10-CM | POA: Diagnosis not present

## 2015-09-26 DIAGNOSIS — I2510 Atherosclerotic heart disease of native coronary artery without angina pectoris: Secondary | ICD-10-CM | POA: Diagnosis not present

## 2015-09-30 DIAGNOSIS — F419 Anxiety disorder, unspecified: Secondary | ICD-10-CM | POA: Diagnosis not present

## 2015-09-30 DIAGNOSIS — K219 Gastro-esophageal reflux disease without esophagitis: Secondary | ICD-10-CM | POA: Diagnosis not present

## 2015-09-30 DIAGNOSIS — C3411 Malignant neoplasm of upper lobe, right bronchus or lung: Secondary | ICD-10-CM | POA: Diagnosis not present

## 2015-09-30 DIAGNOSIS — E784 Other hyperlipidemia: Secondary | ICD-10-CM | POA: Diagnosis not present

## 2015-09-30 DIAGNOSIS — E079 Disorder of thyroid, unspecified: Secondary | ICD-10-CM | POA: Diagnosis not present

## 2015-09-30 DIAGNOSIS — I10 Essential (primary) hypertension: Secondary | ICD-10-CM | POA: Diagnosis not present

## 2015-09-30 DIAGNOSIS — Z7689 Persons encountering health services in other specified circumstances: Secondary | ICD-10-CM | POA: Diagnosis not present

## 2015-10-01 ENCOUNTER — Encounter: Payer: Self-pay | Admitting: *Deleted

## 2015-10-03 DIAGNOSIS — R911 Solitary pulmonary nodule: Secondary | ICD-10-CM | POA: Diagnosis not present

## 2015-10-10 ENCOUNTER — Encounter: Payer: Self-pay | Admitting: Family Medicine

## 2015-10-16 DIAGNOSIS — I2510 Atherosclerotic heart disease of native coronary artery without angina pectoris: Secondary | ICD-10-CM | POA: Diagnosis not present

## 2015-10-16 DIAGNOSIS — C73 Malignant neoplasm of thyroid gland: Secondary | ICD-10-CM | POA: Diagnosis not present

## 2015-10-16 DIAGNOSIS — F17200 Nicotine dependence, unspecified, uncomplicated: Secondary | ICD-10-CM | POA: Diagnosis not present

## 2015-10-16 DIAGNOSIS — I10 Essential (primary) hypertension: Secondary | ICD-10-CM | POA: Diagnosis not present

## 2015-10-16 DIAGNOSIS — I700 Atherosclerosis of aorta: Secondary | ICD-10-CM | POA: Diagnosis not present

## 2015-10-16 DIAGNOSIS — Z6824 Body mass index (BMI) 24.0-24.9, adult: Secondary | ICD-10-CM | POA: Diagnosis not present

## 2015-10-16 DIAGNOSIS — E7800 Pure hypercholesterolemia, unspecified: Secondary | ICD-10-CM | POA: Diagnosis not present

## 2015-10-24 DIAGNOSIS — J449 Chronic obstructive pulmonary disease, unspecified: Secondary | ICD-10-CM | POA: Diagnosis not present

## 2015-10-24 DIAGNOSIS — R0602 Shortness of breath: Secondary | ICD-10-CM | POA: Diagnosis not present

## 2015-10-24 DIAGNOSIS — E079 Disorder of thyroid, unspecified: Secondary | ICD-10-CM | POA: Diagnosis not present

## 2015-10-24 DIAGNOSIS — R1032 Left lower quadrant pain: Secondary | ICD-10-CM | POA: Diagnosis not present

## 2015-10-24 DIAGNOSIS — R911 Solitary pulmonary nodule: Secondary | ICD-10-CM | POA: Diagnosis not present

## 2015-10-24 DIAGNOSIS — E042 Nontoxic multinodular goiter: Secondary | ICD-10-CM | POA: Diagnosis not present

## 2015-10-24 DIAGNOSIS — Z87891 Personal history of nicotine dependence: Secondary | ICD-10-CM | POA: Diagnosis not present

## 2015-10-24 DIAGNOSIS — Z720 Tobacco use: Secondary | ICD-10-CM | POA: Diagnosis not present

## 2015-10-24 DIAGNOSIS — I10 Essential (primary) hypertension: Secondary | ICD-10-CM | POA: Diagnosis not present

## 2015-10-24 DIAGNOSIS — E7800 Pure hypercholesterolemia, unspecified: Secondary | ICD-10-CM | POA: Diagnosis not present

## 2015-10-24 DIAGNOSIS — F329 Major depressive disorder, single episode, unspecified: Secondary | ICD-10-CM | POA: Diagnosis not present

## 2015-10-24 DIAGNOSIS — Z01818 Encounter for other preprocedural examination: Secondary | ICD-10-CM | POA: Diagnosis not present

## 2015-10-24 DIAGNOSIS — F17210 Nicotine dependence, cigarettes, uncomplicated: Secondary | ICD-10-CM | POA: Diagnosis not present

## 2015-10-24 DIAGNOSIS — K219 Gastro-esophageal reflux disease without esophagitis: Secondary | ICD-10-CM | POA: Diagnosis not present

## 2015-11-05 DIAGNOSIS — R195 Other fecal abnormalities: Secondary | ICD-10-CM | POA: Diagnosis not present

## 2015-11-05 DIAGNOSIS — Z6824 Body mass index (BMI) 24.0-24.9, adult: Secondary | ICD-10-CM | POA: Diagnosis not present

## 2015-11-05 DIAGNOSIS — R1084 Generalized abdominal pain: Secondary | ICD-10-CM | POA: Diagnosis not present

## 2015-11-07 DIAGNOSIS — R195 Other fecal abnormalities: Secondary | ICD-10-CM | POA: Diagnosis not present

## 2015-11-07 DIAGNOSIS — Z1211 Encounter for screening for malignant neoplasm of colon: Secondary | ICD-10-CM | POA: Diagnosis not present

## 2015-11-13 DIAGNOSIS — R110 Nausea: Secondary | ICD-10-CM | POA: Diagnosis not present

## 2015-11-13 DIAGNOSIS — Z79899 Other long term (current) drug therapy: Secondary | ICD-10-CM | POA: Diagnosis not present

## 2015-11-13 DIAGNOSIS — I10 Essential (primary) hypertension: Secondary | ICD-10-CM | POA: Diagnosis not present

## 2015-11-13 DIAGNOSIS — J449 Chronic obstructive pulmonary disease, unspecified: Secondary | ICD-10-CM | POA: Diagnosis not present

## 2015-11-13 DIAGNOSIS — K5909 Other constipation: Secondary | ICD-10-CM | POA: Diagnosis not present

## 2015-11-13 DIAGNOSIS — I700 Atherosclerosis of aorta: Secondary | ICD-10-CM | POA: Diagnosis not present

## 2015-11-13 DIAGNOSIS — E7800 Pure hypercholesterolemia, unspecified: Secondary | ICD-10-CM | POA: Diagnosis not present

## 2015-11-13 DIAGNOSIS — Z87891 Personal history of nicotine dependence: Secondary | ICD-10-CM | POA: Diagnosis not present

## 2015-11-13 DIAGNOSIS — K219 Gastro-esophageal reflux disease without esophagitis: Secondary | ICD-10-CM | POA: Diagnosis not present

## 2015-11-13 DIAGNOSIS — Z885 Allergy status to narcotic agent status: Secondary | ICD-10-CM | POA: Diagnosis not present

## 2015-11-13 DIAGNOSIS — R194 Change in bowel habit: Secondary | ICD-10-CM | POA: Diagnosis not present

## 2015-11-13 DIAGNOSIS — R918 Other nonspecific abnormal finding of lung field: Secondary | ICD-10-CM | POA: Diagnosis not present

## 2015-11-13 DIAGNOSIS — R1032 Left lower quadrant pain: Secondary | ICD-10-CM | POA: Diagnosis not present

## 2015-11-17 DIAGNOSIS — R110 Nausea: Secondary | ICD-10-CM | POA: Diagnosis not present

## 2015-11-27 DIAGNOSIS — R911 Solitary pulmonary nodule: Secondary | ICD-10-CM | POA: Diagnosis not present

## 2015-11-28 DIAGNOSIS — Z01810 Encounter for preprocedural cardiovascular examination: Secondary | ICD-10-CM | POA: Diagnosis not present

## 2015-11-28 DIAGNOSIS — Z886 Allergy status to analgesic agent status: Secondary | ICD-10-CM | POA: Diagnosis not present

## 2015-11-28 DIAGNOSIS — Z938 Other artificial opening status: Secondary | ICD-10-CM | POA: Diagnosis not present

## 2015-11-28 DIAGNOSIS — Z7982 Long term (current) use of aspirin: Secondary | ICD-10-CM | POA: Diagnosis not present

## 2015-11-28 DIAGNOSIS — J939 Pneumothorax, unspecified: Secondary | ICD-10-CM | POA: Diagnosis not present

## 2015-11-28 DIAGNOSIS — R918 Other nonspecific abnormal finding of lung field: Secondary | ICD-10-CM | POA: Diagnosis not present

## 2015-11-28 DIAGNOSIS — F329 Major depressive disorder, single episode, unspecified: Secondary | ICD-10-CM | POA: Diagnosis present

## 2015-11-28 DIAGNOSIS — J449 Chronic obstructive pulmonary disease, unspecified: Secondary | ICD-10-CM | POA: Diagnosis present

## 2015-11-28 DIAGNOSIS — I10 Essential (primary) hypertension: Secondary | ICD-10-CM | POA: Diagnosis present

## 2015-11-28 DIAGNOSIS — K219 Gastro-esophageal reflux disease without esophagitis: Secondary | ICD-10-CM | POA: Diagnosis present

## 2015-11-28 DIAGNOSIS — F419 Anxiety disorder, unspecified: Secondary | ICD-10-CM | POA: Diagnosis present

## 2015-11-28 DIAGNOSIS — E785 Hyperlipidemia, unspecified: Secondary | ICD-10-CM | POA: Diagnosis present

## 2015-11-28 DIAGNOSIS — R911 Solitary pulmonary nodule: Secondary | ICD-10-CM | POA: Diagnosis not present

## 2015-11-28 DIAGNOSIS — Z87891 Personal history of nicotine dependence: Secondary | ICD-10-CM | POA: Diagnosis not present

## 2015-11-28 DIAGNOSIS — J95811 Postprocedural pneumothorax: Secondary | ICD-10-CM | POA: Diagnosis not present

## 2015-12-03 DIAGNOSIS — M79632 Pain in left forearm: Secondary | ICD-10-CM | POA: Diagnosis not present

## 2015-12-23 ENCOUNTER — Ambulatory Visit: Payer: Medicare Other | Admitting: Family Medicine

## 2015-12-30 DIAGNOSIS — Z720 Tobacco use: Secondary | ICD-10-CM | POA: Diagnosis not present

## 2015-12-30 DIAGNOSIS — C3411 Malignant neoplasm of upper lobe, right bronchus or lung: Secondary | ICD-10-CM | POA: Diagnosis not present

## 2015-12-30 DIAGNOSIS — J449 Chronic obstructive pulmonary disease, unspecified: Secondary | ICD-10-CM | POA: Diagnosis not present

## 2015-12-30 DIAGNOSIS — R911 Solitary pulmonary nodule: Secondary | ICD-10-CM | POA: Diagnosis not present

## 2016-01-19 DIAGNOSIS — R911 Solitary pulmonary nodule: Secondary | ICD-10-CM | POA: Diagnosis not present

## 2016-01-26 DIAGNOSIS — Z6824 Body mass index (BMI) 24.0-24.9, adult: Secondary | ICD-10-CM | POA: Diagnosis not present

## 2016-01-26 DIAGNOSIS — I700 Atherosclerosis of aorta: Secondary | ICD-10-CM | POA: Diagnosis not present

## 2016-01-26 DIAGNOSIS — E7800 Pure hypercholesterolemia, unspecified: Secondary | ICD-10-CM | POA: Diagnosis not present

## 2016-01-26 DIAGNOSIS — I2510 Atherosclerotic heart disease of native coronary artery without angina pectoris: Secondary | ICD-10-CM | POA: Diagnosis not present

## 2016-01-26 DIAGNOSIS — I10 Essential (primary) hypertension: Secondary | ICD-10-CM | POA: Diagnosis not present

## 2016-01-26 DIAGNOSIS — R918 Other nonspecific abnormal finding of lung field: Secondary | ICD-10-CM | POA: Diagnosis not present

## 2016-01-26 DIAGNOSIS — F17200 Nicotine dependence, unspecified, uncomplicated: Secondary | ICD-10-CM | POA: Diagnosis not present

## 2016-01-27 DIAGNOSIS — Z720 Tobacco use: Secondary | ICD-10-CM | POA: Diagnosis not present

## 2016-01-27 DIAGNOSIS — C3411 Malignant neoplasm of upper lobe, right bronchus or lung: Secondary | ICD-10-CM | POA: Diagnosis not present

## 2016-01-27 DIAGNOSIS — J449 Chronic obstructive pulmonary disease, unspecified: Secondary | ICD-10-CM | POA: Diagnosis not present

## 2016-01-29 ENCOUNTER — Encounter: Payer: Medicare Other | Admitting: Family Medicine

## 2016-02-03 ENCOUNTER — Telehealth: Payer: Self-pay | Admitting: Gastroenterology

## 2016-02-03 NOTE — Telephone Encounter (Signed)
Pt dropped off previous GI records from [place] is requesting to switch back from [facility] to [facility] b/c she is not satisfied with [facility]. Pt stated "I feel like you are in an assembly line and I am not getting the care I need" Pt saw Alonza Bogus on 08-06-15 and OV note was not signed by a physician.  Dr. Eugenia Pancoast was the supervising Doc but Pt was sch'd for a endo/colon with Dr. Fuller Plan and canceled.  Could you please advise does this need to go to Dr. Eugenia Pancoast or Dr. Fuller Plan and would they accept the patient back?

## 2016-02-09 NOTE — Telephone Encounter (Signed)
Dr. Fuller Plan declined to accept patient.  Patient notified

## 2016-02-16 DIAGNOSIS — H1852 Epithelial (juvenile) corneal dystrophy: Secondary | ICD-10-CM | POA: Diagnosis not present

## 2016-02-16 DIAGNOSIS — H2513 Age-related nuclear cataract, bilateral: Secondary | ICD-10-CM | POA: Diagnosis not present

## 2016-02-16 DIAGNOSIS — H01003 Unspecified blepharitis right eye, unspecified eyelid: Secondary | ICD-10-CM | POA: Diagnosis not present

## 2016-02-16 DIAGNOSIS — H25013 Cortical age-related cataract, bilateral: Secondary | ICD-10-CM | POA: Diagnosis not present

## 2016-02-20 DIAGNOSIS — I700 Atherosclerosis of aorta: Secondary | ICD-10-CM | POA: Diagnosis not present

## 2016-03-09 DIAGNOSIS — Z6824 Body mass index (BMI) 24.0-24.9, adult: Secondary | ICD-10-CM | POA: Diagnosis not present

## 2016-03-09 DIAGNOSIS — I10 Essential (primary) hypertension: Secondary | ICD-10-CM | POA: Diagnosis not present

## 2016-03-09 DIAGNOSIS — E784 Other hyperlipidemia: Secondary | ICD-10-CM | POA: Diagnosis not present

## 2016-03-09 DIAGNOSIS — E039 Hypothyroidism, unspecified: Secondary | ICD-10-CM | POA: Diagnosis not present

## 2016-03-09 DIAGNOSIS — F419 Anxiety disorder, unspecified: Secondary | ICD-10-CM | POA: Diagnosis not present

## 2016-03-10 DIAGNOSIS — H1852 Epithelial (juvenile) corneal dystrophy: Secondary | ICD-10-CM | POA: Diagnosis not present

## 2016-03-10 DIAGNOSIS — H01003 Unspecified blepharitis right eye, unspecified eyelid: Secondary | ICD-10-CM | POA: Diagnosis not present

## 2016-03-10 DIAGNOSIS — R109 Unspecified abdominal pain: Secondary | ICD-10-CM | POA: Diagnosis not present

## 2016-03-10 DIAGNOSIS — R1032 Left lower quadrant pain: Secondary | ICD-10-CM | POA: Diagnosis not present

## 2016-03-10 DIAGNOSIS — H25013 Cortical age-related cataract, bilateral: Secondary | ICD-10-CM | POA: Diagnosis not present

## 2016-03-10 DIAGNOSIS — H2513 Age-related nuclear cataract, bilateral: Secondary | ICD-10-CM | POA: Diagnosis not present

## 2016-03-12 ENCOUNTER — Encounter: Payer: Self-pay | Admitting: Family Medicine

## 2016-03-12 DIAGNOSIS — E784 Other hyperlipidemia: Secondary | ICD-10-CM | POA: Diagnosis not present

## 2016-03-12 DIAGNOSIS — I10 Essential (primary) hypertension: Secondary | ICD-10-CM | POA: Diagnosis not present

## 2016-03-12 DIAGNOSIS — E039 Hypothyroidism, unspecified: Secondary | ICD-10-CM | POA: Diagnosis not present

## 2016-03-16 DIAGNOSIS — R1032 Left lower quadrant pain: Secondary | ICD-10-CM | POA: Diagnosis not present

## 2016-03-19 DIAGNOSIS — Z1231 Encounter for screening mammogram for malignant neoplasm of breast: Secondary | ICD-10-CM | POA: Diagnosis not present

## 2016-03-19 DIAGNOSIS — Z1239 Encounter for other screening for malignant neoplasm of breast: Secondary | ICD-10-CM | POA: Diagnosis not present

## 2016-04-28 DIAGNOSIS — I700 Atherosclerosis of aorta: Secondary | ICD-10-CM | POA: Diagnosis not present

## 2016-04-28 DIAGNOSIS — C3400 Malignant neoplasm of unspecified main bronchus: Secondary | ICD-10-CM | POA: Diagnosis not present

## 2016-04-28 DIAGNOSIS — I10 Essential (primary) hypertension: Secondary | ICD-10-CM | POA: Diagnosis not present

## 2016-04-28 DIAGNOSIS — E7800 Pure hypercholesterolemia, unspecified: Secondary | ICD-10-CM | POA: Diagnosis not present

## 2016-04-28 DIAGNOSIS — I2510 Atherosclerotic heart disease of native coronary artery without angina pectoris: Secondary | ICD-10-CM | POA: Diagnosis not present

## 2016-05-07 DIAGNOSIS — R911 Solitary pulmonary nodule: Secondary | ICD-10-CM | POA: Diagnosis not present

## 2016-05-21 DIAGNOSIS — N9489 Other specified conditions associated with female genital organs and menstrual cycle: Secondary | ICD-10-CM | POA: Diagnosis not present

## 2016-06-02 DIAGNOSIS — M79669 Pain in unspecified lower leg: Secondary | ICD-10-CM | POA: Diagnosis not present

## 2016-06-02 DIAGNOSIS — M1990 Unspecified osteoarthritis, unspecified site: Secondary | ICD-10-CM | POA: Diagnosis not present

## 2016-06-16 DIAGNOSIS — Z01419 Encounter for gynecological examination (general) (routine) without abnormal findings: Secondary | ICD-10-CM | POA: Diagnosis not present

## 2016-06-16 DIAGNOSIS — Z124 Encounter for screening for malignant neoplasm of cervix: Secondary | ICD-10-CM | POA: Diagnosis not present

## 2016-07-28 DIAGNOSIS — R911 Solitary pulmonary nodule: Secondary | ICD-10-CM | POA: Diagnosis not present

## 2016-08-27 DIAGNOSIS — J0141 Acute recurrent pansinusitis: Secondary | ICD-10-CM | POA: Diagnosis not present

## 2016-08-27 DIAGNOSIS — Z6825 Body mass index (BMI) 25.0-25.9, adult: Secondary | ICD-10-CM | POA: Diagnosis not present

## 2016-08-27 DIAGNOSIS — J40 Bronchitis, not specified as acute or chronic: Secondary | ICD-10-CM | POA: Diagnosis not present

## 2016-08-27 DIAGNOSIS — J060 Acute laryngopharyngitis: Secondary | ICD-10-CM | POA: Diagnosis not present

## 2016-11-19 DIAGNOSIS — R911 Solitary pulmonary nodule: Secondary | ICD-10-CM | POA: Diagnosis not present

## 2017-01-27 DIAGNOSIS — M8588 Other specified disorders of bone density and structure, other site: Secondary | ICD-10-CM | POA: Diagnosis not present

## 2017-01-27 DIAGNOSIS — Z780 Asymptomatic menopausal state: Secondary | ICD-10-CM | POA: Diagnosis not present

## 2017-01-31 DIAGNOSIS — Z2821 Immunization not carried out because of patient refusal: Secondary | ICD-10-CM | POA: Diagnosis not present

## 2017-01-31 DIAGNOSIS — Z1159 Encounter for screening for other viral diseases: Secondary | ICD-10-CM | POA: Diagnosis not present

## 2017-01-31 DIAGNOSIS — I10 Essential (primary) hypertension: Secondary | ICD-10-CM | POA: Diagnosis not present

## 2017-01-31 DIAGNOSIS — E890 Postprocedural hypothyroidism: Secondary | ICD-10-CM | POA: Diagnosis not present

## 2017-01-31 DIAGNOSIS — M8589 Other specified disorders of bone density and structure, multiple sites: Secondary | ICD-10-CM | POA: Diagnosis not present

## 2017-01-31 DIAGNOSIS — R7301 Impaired fasting glucose: Secondary | ICD-10-CM | POA: Diagnosis not present

## 2017-01-31 DIAGNOSIS — F419 Anxiety disorder, unspecified: Secondary | ICD-10-CM | POA: Diagnosis not present

## 2017-01-31 DIAGNOSIS — E784 Other hyperlipidemia: Secondary | ICD-10-CM | POA: Diagnosis not present

## 2017-01-31 DIAGNOSIS — E079 Disorder of thyroid, unspecified: Secondary | ICD-10-CM | POA: Diagnosis not present

## 2017-02-20 IMAGING — US US ABDOMEN COMPLETE
1 series · 13 of 25 positions shown · non-contrast
Comparison: None.

CLINICAL DATA: Epigastric pain

EXAM:
ULTRASOUND ABDOMEN COMPLETE

[Series 1: us abdomen complete · 0.14mm/px · 13 of 95 slices shown]
[im 1/95]
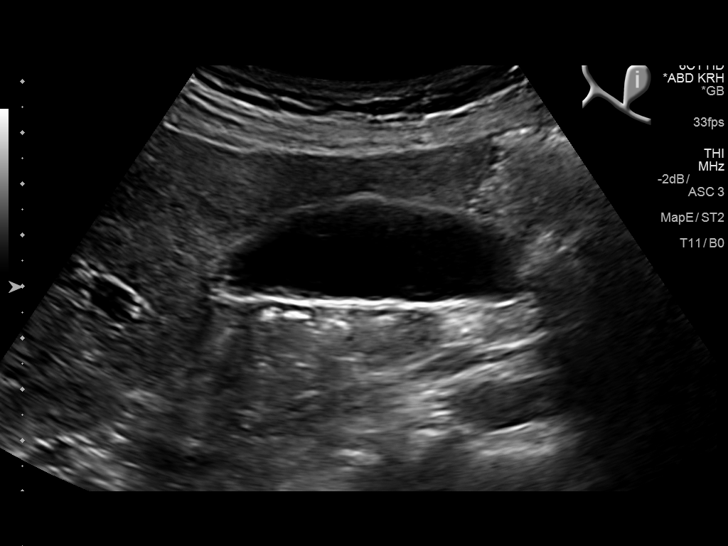
[im 8/95]
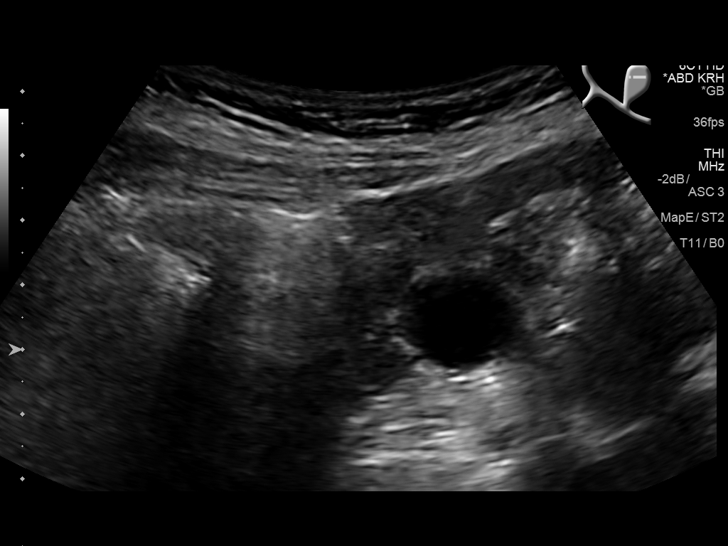
[im 16/95]
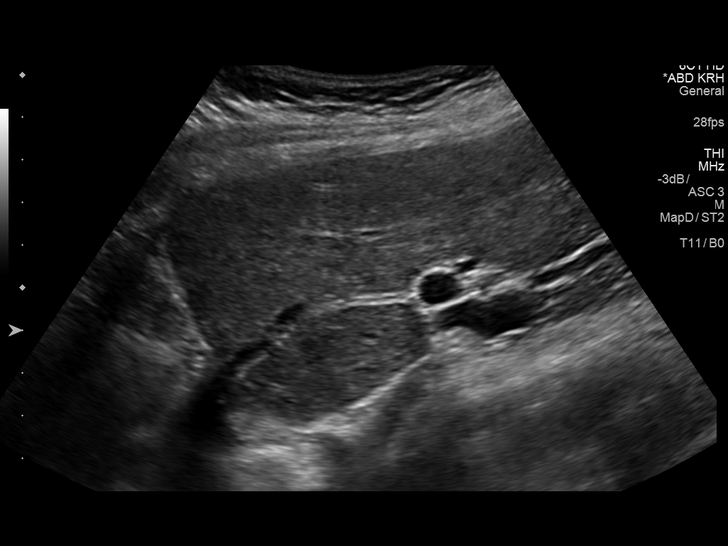
[im 24/95]
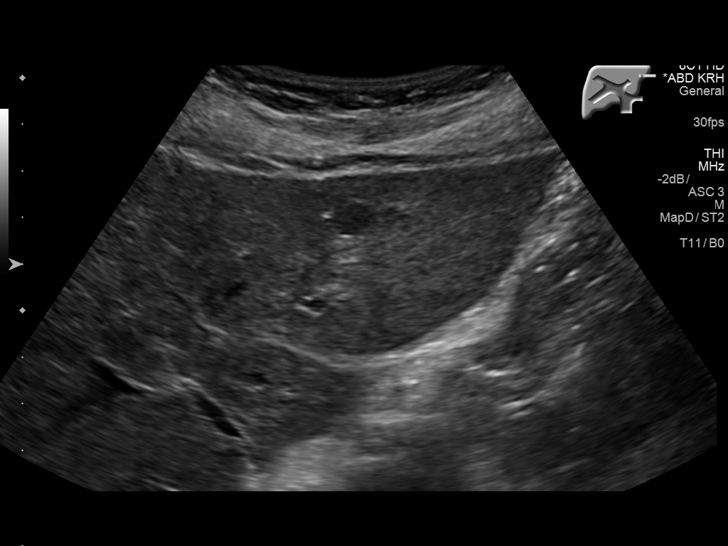
[im 32/95]
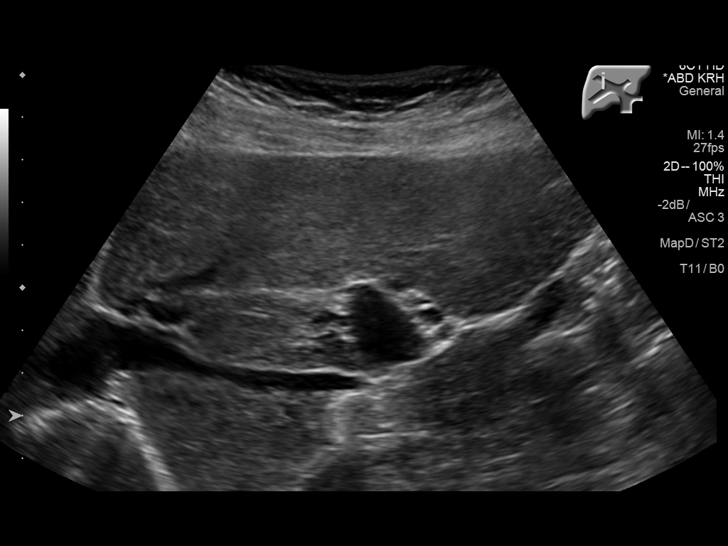
[im 40/95]
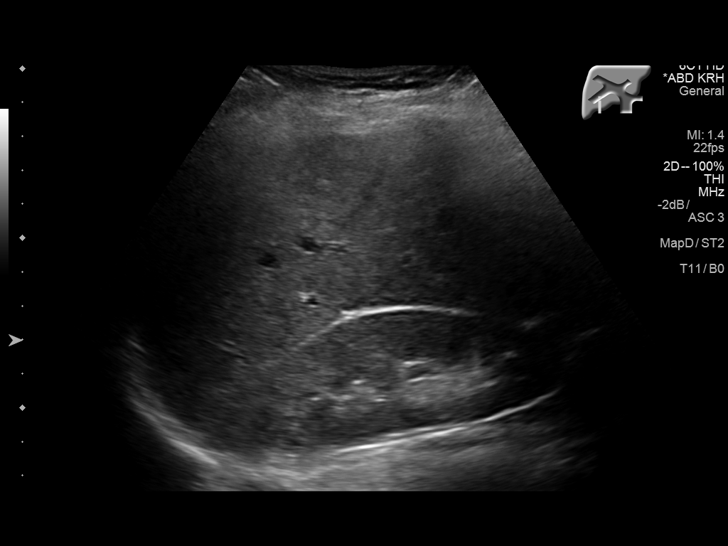
[im 48/95]
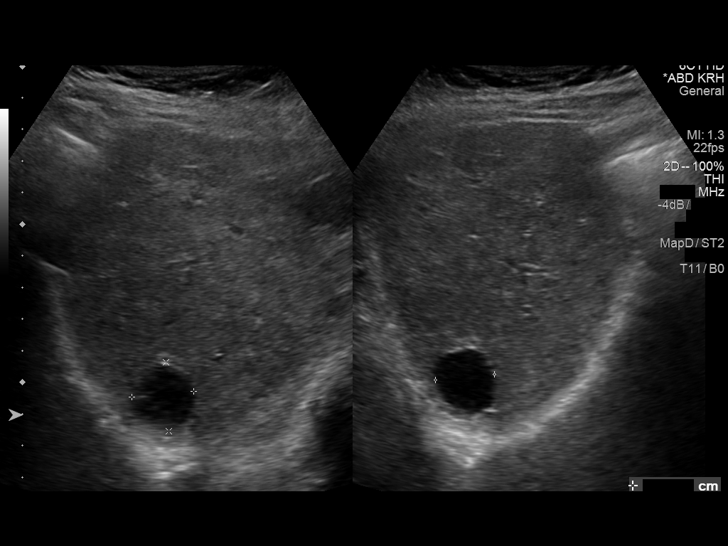
[im 55/95]
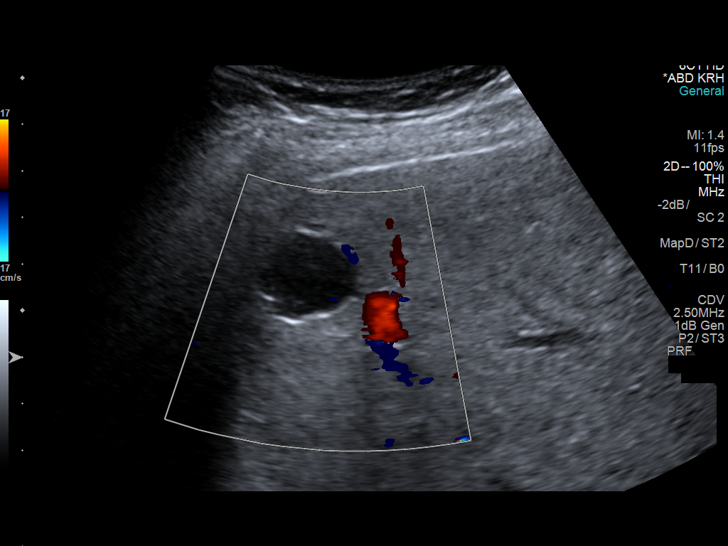
[im 63/95]
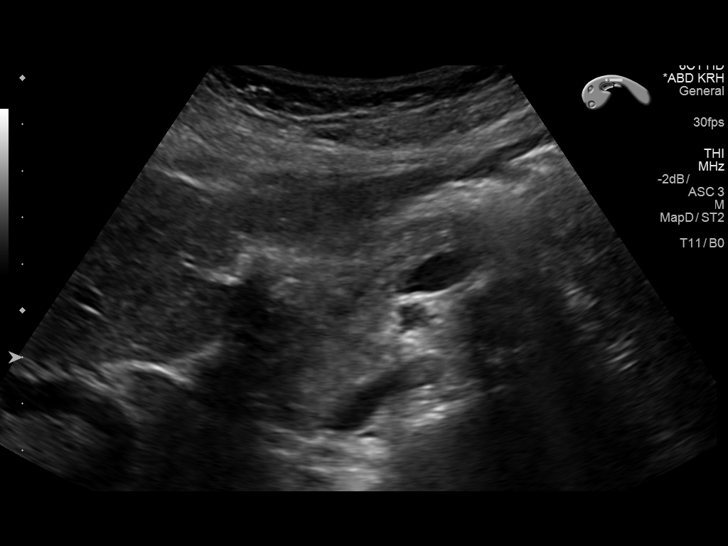
[im 71/95]
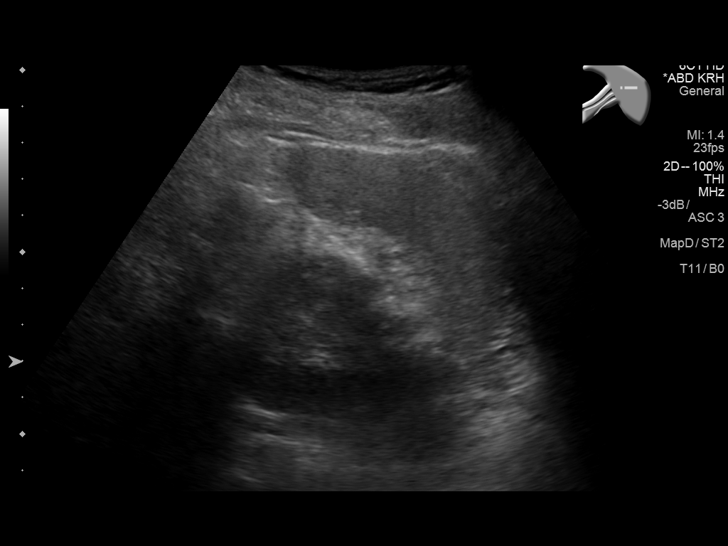
[im 79/95]
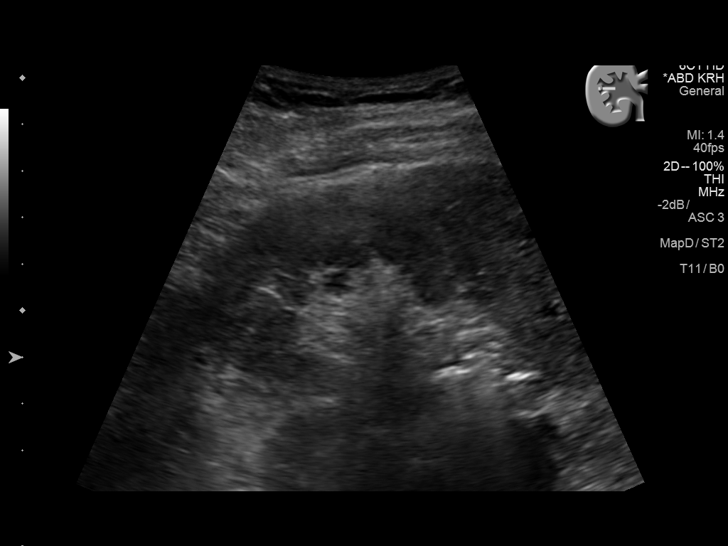
[im 87/95]
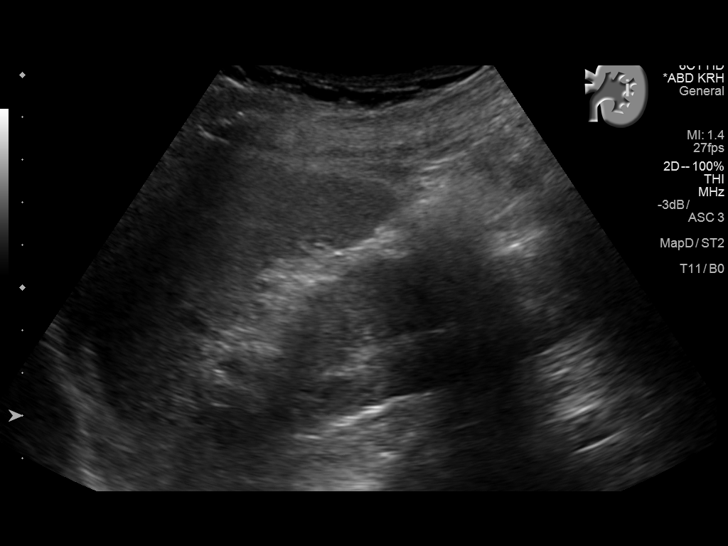
[im 95/95]
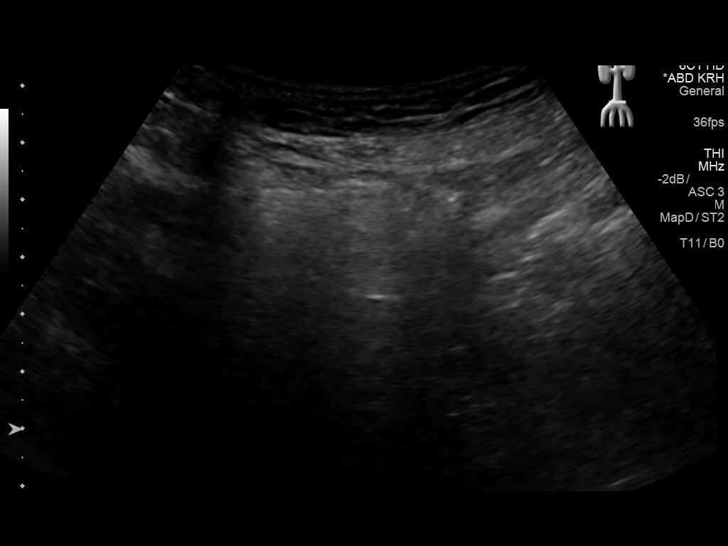

[13 of 25 positions shown; findings below may reference images not displayed]

FINDINGS: Gallbladder: No gallstones or wall thickening visualized. No
sonographic Murphy sign noted.

Common bile duct: Diameter: 2 mm

Liver: There are 3 lesions in the liver. They measure 1.2 x 1.1 x
0.8 cm in the left lobe, 2.0 x 2.2 x 1.9 cm at the dome, and 2.4 x
2.4 x 2.0 cm in the anterior mid right lobe. They are hypoechoic
with some internal signal but demonstrate through transmission. The
lesion towards the dome does have some septations. These are not
simple cysts. The accompanying chest CT is also evaluated and the
hypoechoic area towards the dome corresponds to a Hounsfield unit
measurement of 6 which is fluid. No definite solid masses. Normal
echogenicity otherwise.

IVC: No abnormality visualized.

Pancreas: Visualized portion unremarkable.

Spleen: Size and appearance within normal limits.

Right Kidney: Length: 11.1 cm. Echogenicity within normal limits. No
mass or hydronephrosis visualized.

Left Kidney: Length: 10.9 cm. Echogenicity within normal limits. No
mass or hydronephrosis visualized.

Abdominal aorta: Maximal diameter of the aorta is 2.7 cm.

Other findings: None.
IMPRESSION: There are 3 complex cystic lesions within the liver. These are
likely benign however underlying cystic malignancy cannot be
entirely excluded. Six-month follow-up ultrasound is recommended to
further characterize and ensure stability. Stability over course of
2 years supports benign etiology.

No evidence of gallstones.

## 2017-05-04 DIAGNOSIS — I10 Essential (primary) hypertension: Secondary | ICD-10-CM | POA: Diagnosis not present

## 2017-05-04 DIAGNOSIS — R110 Nausea: Secondary | ICD-10-CM | POA: Diagnosis not present

## 2017-05-04 DIAGNOSIS — K581 Irritable bowel syndrome with constipation: Secondary | ICD-10-CM | POA: Diagnosis not present

## 2017-05-04 DIAGNOSIS — Z6824 Body mass index (BMI) 24.0-24.9, adult: Secondary | ICD-10-CM | POA: Diagnosis not present

## 2017-05-23 DIAGNOSIS — E7800 Pure hypercholesterolemia, unspecified: Secondary | ICD-10-CM | POA: Diagnosis not present

## 2017-05-23 DIAGNOSIS — R0602 Shortness of breath: Secondary | ICD-10-CM | POA: Diagnosis not present

## 2017-05-23 DIAGNOSIS — Z87891 Personal history of nicotine dependence: Secondary | ICD-10-CM | POA: Diagnosis not present

## 2017-05-23 DIAGNOSIS — J449 Chronic obstructive pulmonary disease, unspecified: Secondary | ICD-10-CM | POA: Diagnosis not present

## 2017-05-23 DIAGNOSIS — Z0000 Encounter for general adult medical examination without abnormal findings: Secondary | ICD-10-CM | POA: Diagnosis not present

## 2017-05-23 DIAGNOSIS — F17200 Nicotine dependence, unspecified, uncomplicated: Secondary | ICD-10-CM | POA: Diagnosis not present

## 2017-05-23 DIAGNOSIS — I10 Essential (primary) hypertension: Secondary | ICD-10-CM | POA: Diagnosis not present

## 2017-06-03 DIAGNOSIS — F17210 Nicotine dependence, cigarettes, uncomplicated: Secondary | ICD-10-CM | POA: Diagnosis not present

## 2017-06-03 DIAGNOSIS — I2510 Atherosclerotic heart disease of native coronary artery without angina pectoris: Secondary | ICD-10-CM | POA: Diagnosis not present

## 2017-06-03 DIAGNOSIS — R911 Solitary pulmonary nodule: Secondary | ICD-10-CM | POA: Diagnosis not present

## 2017-06-03 DIAGNOSIS — I700 Atherosclerosis of aorta: Secondary | ICD-10-CM | POA: Diagnosis not present

## 2017-06-03 DIAGNOSIS — R918 Other nonspecific abnormal finding of lung field: Secondary | ICD-10-CM | POA: Diagnosis not present

## 2017-06-03 DIAGNOSIS — E049 Nontoxic goiter, unspecified: Secondary | ICD-10-CM | POA: Diagnosis not present

## 2017-06-03 DIAGNOSIS — Z87891 Personal history of nicotine dependence: Secondary | ICD-10-CM | POA: Diagnosis not present

## 2017-06-14 ENCOUNTER — Ambulatory Visit: Payer: Medicare Other | Admitting: Family Medicine

## 2017-06-26 DIAGNOSIS — K5909 Other constipation: Secondary | ICD-10-CM | POA: Diagnosis not present

## 2017-06-26 DIAGNOSIS — R1012 Left upper quadrant pain: Secondary | ICD-10-CM | POA: Diagnosis not present

## 2017-06-26 DIAGNOSIS — R1032 Left lower quadrant pain: Secondary | ICD-10-CM | POA: Diagnosis not present

## 2017-06-28 DIAGNOSIS — I700 Atherosclerosis of aorta: Secondary | ICD-10-CM | POA: Diagnosis not present

## 2017-06-28 DIAGNOSIS — J439 Emphysema, unspecified: Secondary | ICD-10-CM | POA: Diagnosis not present

## 2017-06-28 DIAGNOSIS — K5900 Constipation, unspecified: Secondary | ICD-10-CM | POA: Diagnosis not present

## 2017-06-28 DIAGNOSIS — K644 Residual hemorrhoidal skin tags: Secondary | ICD-10-CM | POA: Diagnosis not present

## 2017-06-28 DIAGNOSIS — M47816 Spondylosis without myelopathy or radiculopathy, lumbar region: Secondary | ICD-10-CM | POA: Diagnosis not present

## 2017-06-28 DIAGNOSIS — E7800 Pure hypercholesterolemia, unspecified: Secondary | ICD-10-CM | POA: Diagnosis not present

## 2017-06-28 DIAGNOSIS — Z885 Allergy status to narcotic agent status: Secondary | ICD-10-CM | POA: Diagnosis not present

## 2017-06-28 DIAGNOSIS — I10 Essential (primary) hypertension: Secondary | ICD-10-CM | POA: Diagnosis not present

## 2017-06-28 DIAGNOSIS — R110 Nausea: Secondary | ICD-10-CM | POA: Diagnosis not present

## 2017-06-28 DIAGNOSIS — K7689 Other specified diseases of liver: Secondary | ICD-10-CM | POA: Diagnosis not present

## 2017-06-28 DIAGNOSIS — R1084 Generalized abdominal pain: Secondary | ICD-10-CM | POA: Diagnosis not present

## 2017-06-28 DIAGNOSIS — K219 Gastro-esophageal reflux disease without esophagitis: Secondary | ICD-10-CM | POA: Diagnosis not present

## 2017-06-28 DIAGNOSIS — Z886 Allergy status to analgesic agent status: Secondary | ICD-10-CM | POA: Diagnosis not present

## 2017-06-28 DIAGNOSIS — Z7982 Long term (current) use of aspirin: Secondary | ICD-10-CM | POA: Diagnosis not present

## 2017-06-28 DIAGNOSIS — R1012 Left upper quadrant pain: Secondary | ICD-10-CM | POA: Diagnosis not present

## 2017-06-28 DIAGNOSIS — Z87891 Personal history of nicotine dependence: Secondary | ICD-10-CM | POA: Diagnosis not present

## 2017-06-28 DIAGNOSIS — Z79899 Other long term (current) drug therapy: Secondary | ICD-10-CM | POA: Diagnosis not present

## 2017-06-28 DIAGNOSIS — Z888 Allergy status to other drugs, medicaments and biological substances status: Secondary | ICD-10-CM | POA: Diagnosis not present

## 2017-07-06 DIAGNOSIS — K219 Gastro-esophageal reflux disease without esophagitis: Secondary | ICD-10-CM | POA: Diagnosis not present

## 2017-07-06 DIAGNOSIS — K581 Irritable bowel syndrome with constipation: Secondary | ICD-10-CM | POA: Diagnosis not present

## 2017-07-06 DIAGNOSIS — Z86010 Personal history of colonic polyps: Secondary | ICD-10-CM | POA: Diagnosis not present

## 2017-07-06 DIAGNOSIS — R1032 Left lower quadrant pain: Secondary | ICD-10-CM | POA: Diagnosis not present

## 2017-07-06 DIAGNOSIS — Q433 Congenital malformations of intestinal fixation: Secondary | ICD-10-CM | POA: Diagnosis not present

## 2017-07-11 IMAGING — CT CT ABD-PELV W/ CM
2 of 5 series · 17 of 46 positions shown, 19 images · IV contrast (Omnipaque 300)
Comparison: CT CT 08/04/2015, CT 07/22/2015

CLINICAL DATA: LEFT lower quadrant pain and nausea. Some blood in
stool. New diagnosis lung cancer and thyroid cancer

EXAM:
CT ABDOMEN AND PELVIS WITH CONTRAST
TECHNIQUE: Multidetector CT imaging of the abdomen and pelvis was performed
using the standard protocol following bolus administration of
intravenous contrast.
CONTRAST:  100mL OMNIPAQUE IOHEXOL 300 MG/ML  SOLN

[Series 2: abd/ pel 5mm · axial · 0.61mm/px · z∈[-424,-74]mm · 14 of 78 slices shown, 16 images]
[im 4/78  soft-tissue]
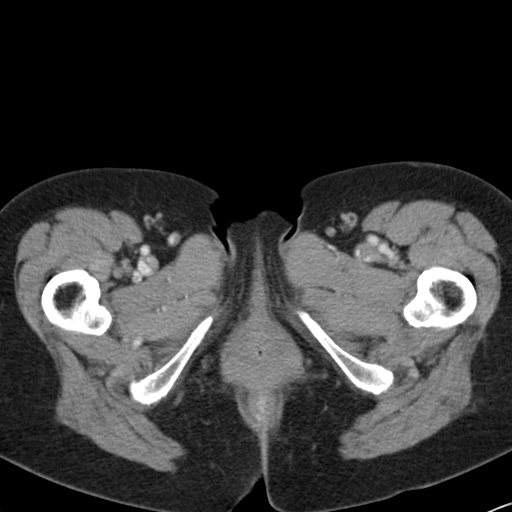
[im 4/78  bone]
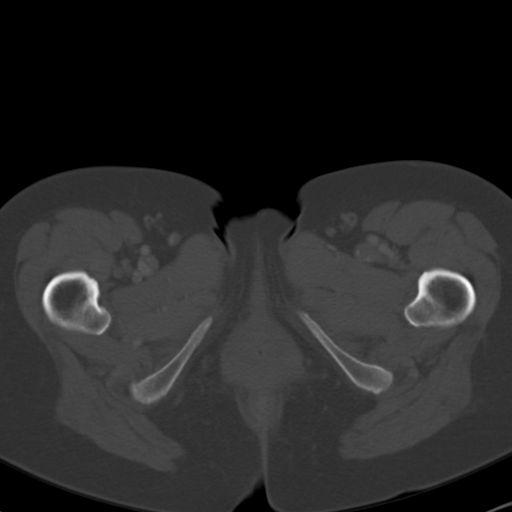
[im 12/78  soft-tissue]
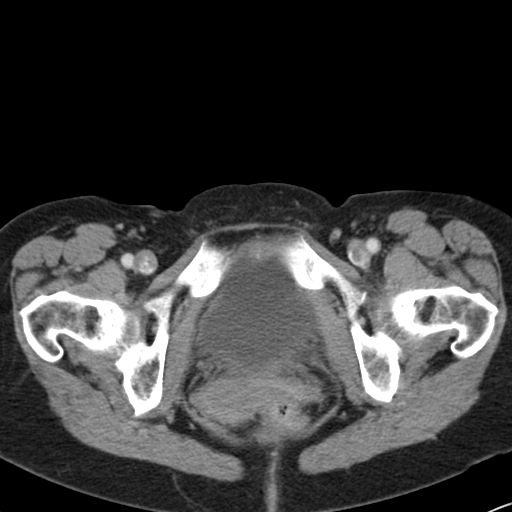
[im 16/78  soft-tissue]
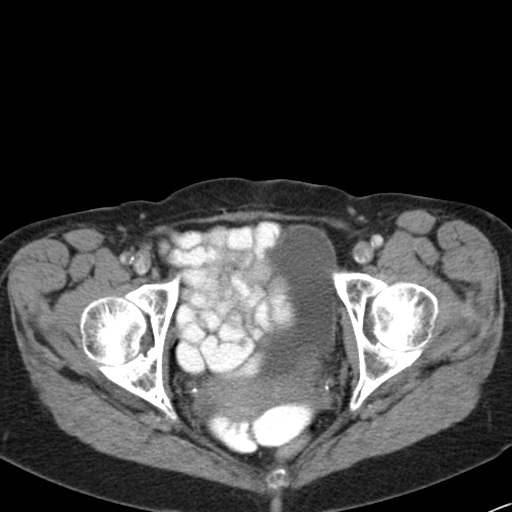
[im 20/78  soft-tissue]
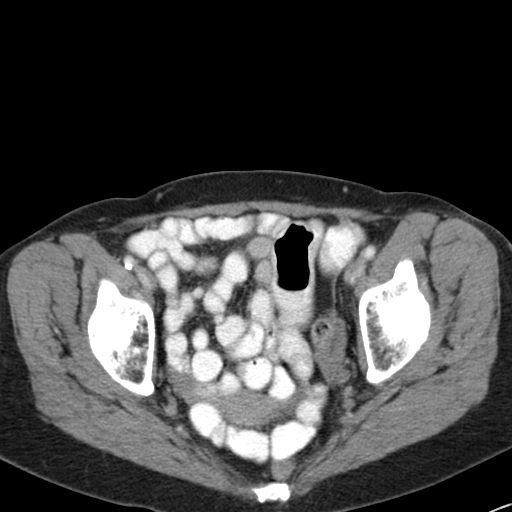
[im 27/78  soft-tissue]
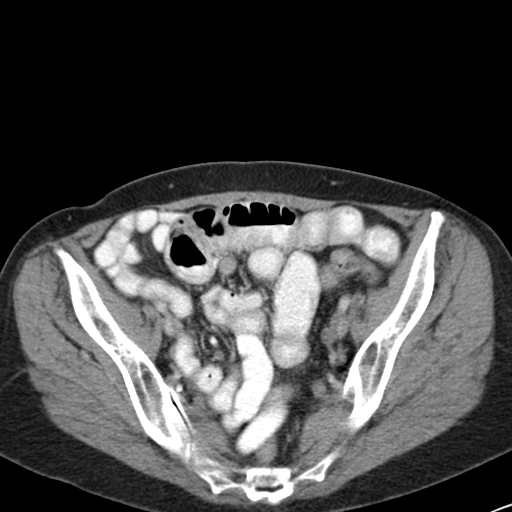
[im 31/78  soft-tissue]
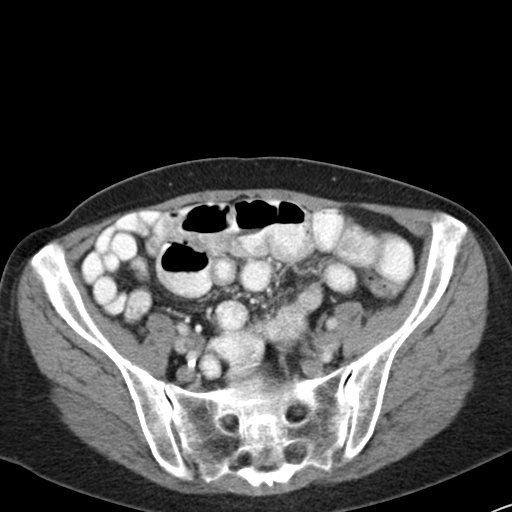
[im 35/78  soft-tissue]
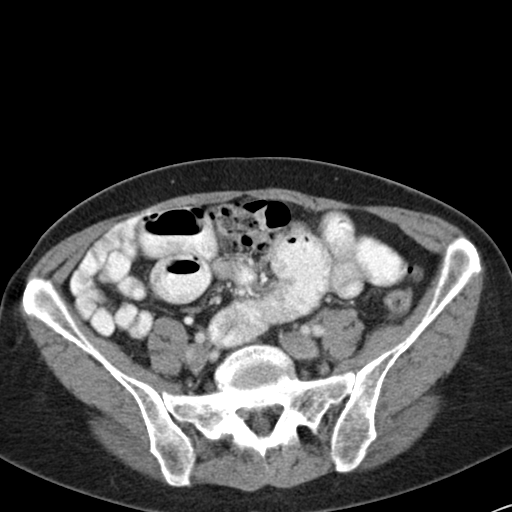
[im 43/78  soft-tissue]
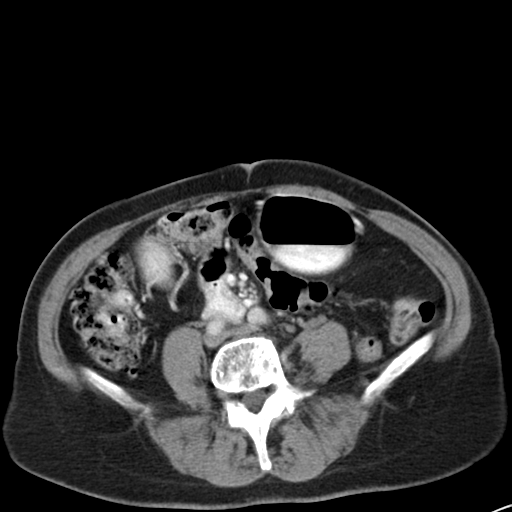
[im 47/78  soft-tissue]
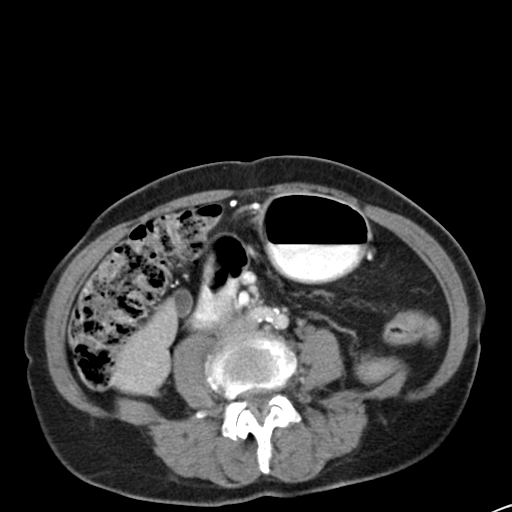
[im 47/78  bone]
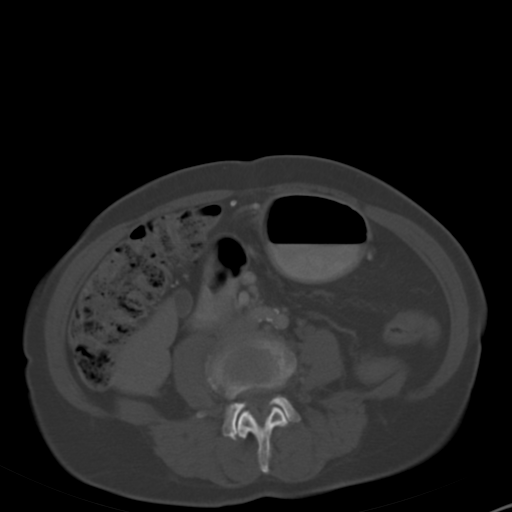
[im 51/78  soft-tissue]
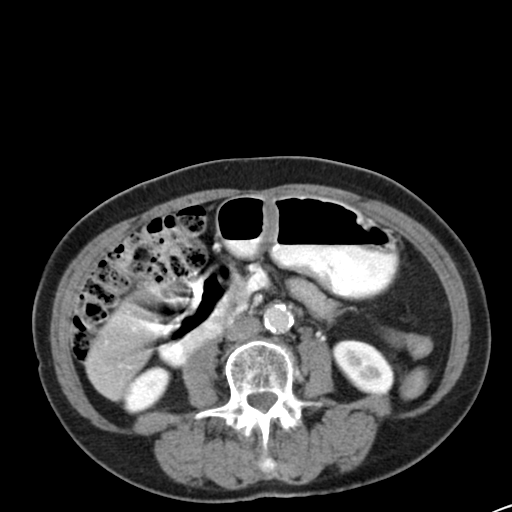
[im 58/78  soft-tissue]
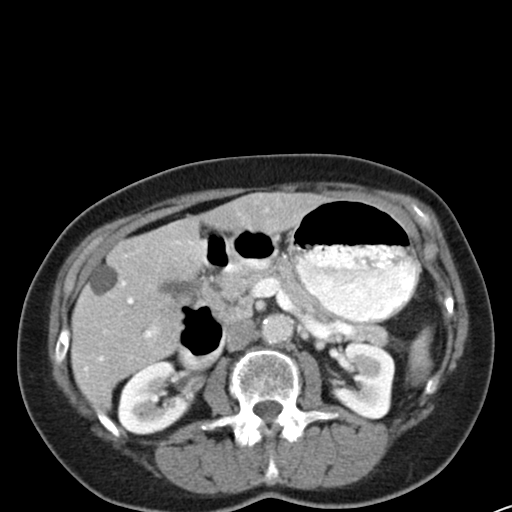
[im 62/78  soft-tissue]
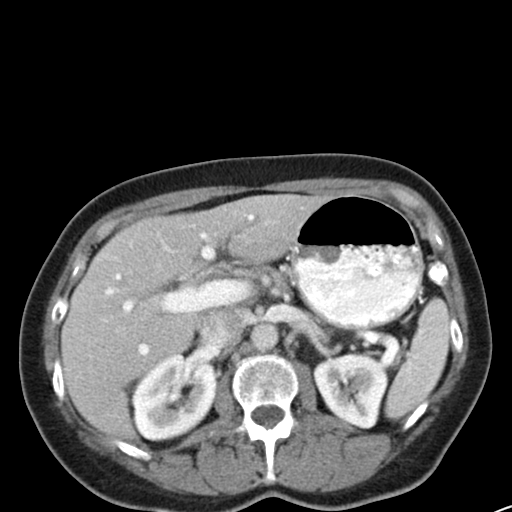
[im 66/78  soft-tissue]
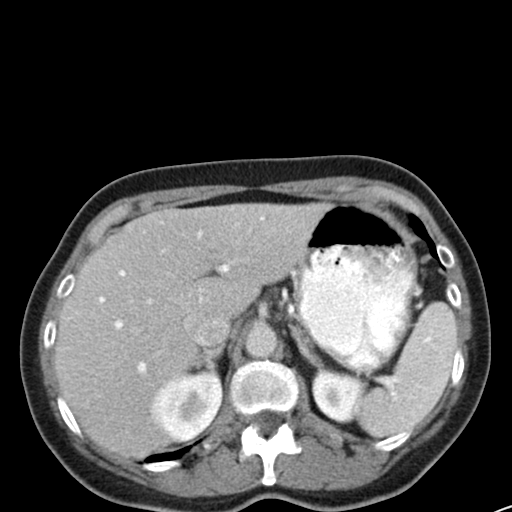
[im 74/78  soft-tissue]
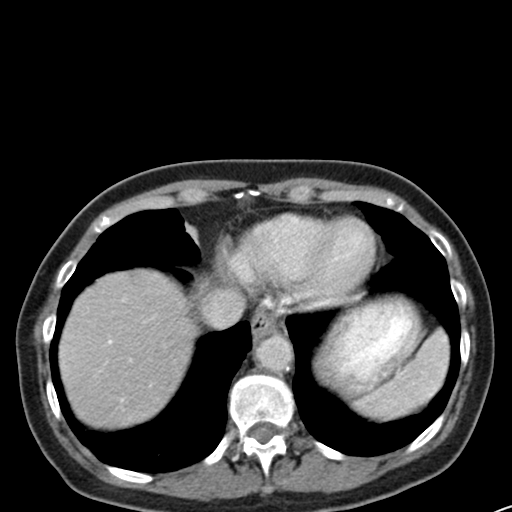

[Series 602: cor · coronal · 0.79mm/px · 3 of 102 slices shown]
[im 34/102  soft-tissue]
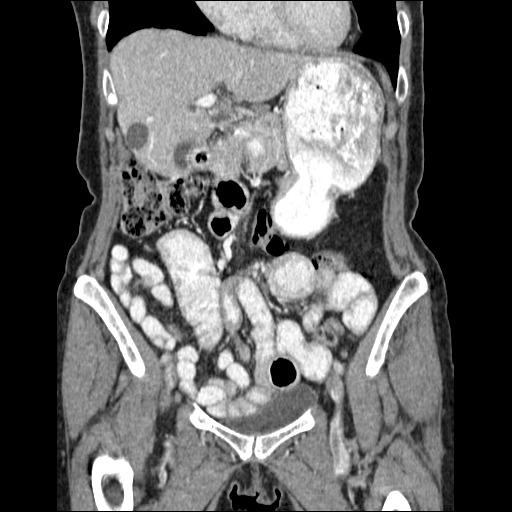
[im 45/102  soft-tissue]
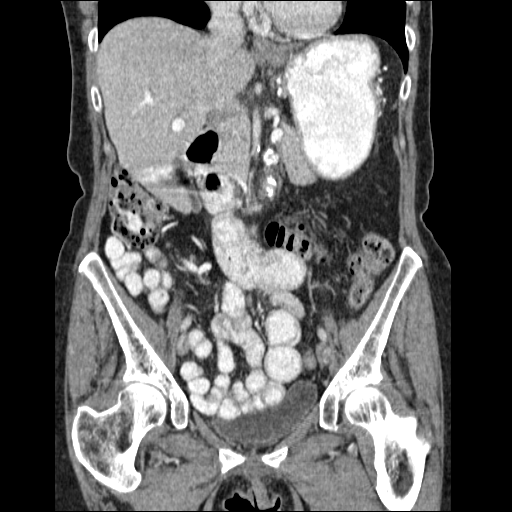
[im 57/102  soft-tissue]
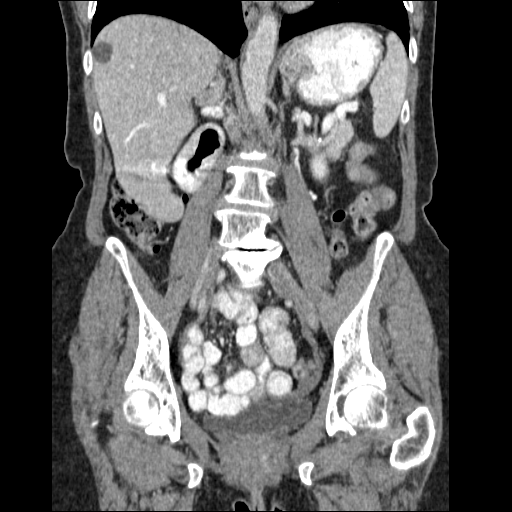

[17 of 46 positions shown; findings below may reference images not displayed]

FINDINGS: Lower chest: Lung bases are clear.

Hepatobiliary: Simple fluid attenuation 8 mm lesion in the LEFT
lateral hepatic lobe consistent benign cysts. Some larger lesions
subcapsular RIGHT hepatic lobe simple fluid in the subcapsular RIGHT
hepatic lobe lesion image 21. No biliary duct dilatation

Pancreas: Pancreas is normal. No ductal dilatation. No pancreatic
inflammation.

Spleen: Normal spleen

Adrenals/urinary tract: Adrenal glands and kidneys are normal. The
ureters and bladder normal.

Stomach/Bowel: Stomach duodenum are normal. The proximal small bowel
is mildly distended to 3.5 cm. The distal small bowel is normal
caliber. Contrast flows the terminal ileum. The cecum is normal. The
appendix is normal. Colon and rectosigmoid colon normal

Vascular/Lymphatic: Abdominal aorta is normal caliber with
atherosclerotic calcification. There is no retroperitoneal or
periportal lymphadenopathy. No pelvic lymphadenopathy.

Reproductive: Uterus and ovaries are normal.

Other: No free fluid.

Musculoskeletal: Degenerate spurring at L5-S1
IMPRESSION: 1. No explanation for LEFT lower quadrant pain.
2. Mildly dilated loops proximal small bowel without evidence
obstructing lesion.
3. Benign hepatic cysts

## 2017-08-19 DIAGNOSIS — F17210 Nicotine dependence, cigarettes, uncomplicated: Secondary | ICD-10-CM | POA: Diagnosis not present

## 2017-08-19 DIAGNOSIS — E0789 Other specified disorders of thyroid: Secondary | ICD-10-CM | POA: Diagnosis not present

## 2017-08-23 DIAGNOSIS — H5203 Hypermetropia, bilateral: Secondary | ICD-10-CM | POA: Diagnosis not present

## 2017-08-23 DIAGNOSIS — H52223 Regular astigmatism, bilateral: Secondary | ICD-10-CM | POA: Diagnosis not present

## 2017-08-23 DIAGNOSIS — H524 Presbyopia: Secondary | ICD-10-CM | POA: Diagnosis not present

## 2017-08-23 DIAGNOSIS — H25813 Combined forms of age-related cataract, bilateral: Secondary | ICD-10-CM | POA: Diagnosis not present

## 2017-08-23 DIAGNOSIS — H1859 Other hereditary corneal dystrophies: Secondary | ICD-10-CM | POA: Diagnosis not present

## 2017-08-23 DIAGNOSIS — H35033 Hypertensive retinopathy, bilateral: Secondary | ICD-10-CM | POA: Diagnosis not present

## 2017-08-23 DIAGNOSIS — H43812 Vitreous degeneration, left eye: Secondary | ICD-10-CM | POA: Diagnosis not present

## 2019-06-14 ENCOUNTER — Telehealth: Payer: Self-pay | Admitting: General Practice

## 2019-06-14 NOTE — Telephone Encounter (Signed)
Encounter closed. Copied from [facility] [phone]. Topic: Appointment Scheduling - Scheduling Inquiry for Clinic >> May 28, 2019 10:13 AM Scherrie Gerlach wrote: Reason for CRM:  pt not seen since 09/2015. Pt had to go to [facility] after being diagnosed with cancer. So she has been there since. Pt wants to know if Dr Charlett Blake will accept her back as a pt.

## 2022-02-01 DEATH — deceased
